# Patient Record
Sex: Male | Born: 1935 | Hispanic: No | Marital: Married | Smoking: Former smoker
Health system: Southern US, Community
[De-identification: ages and names within clinical notes are randomized; demographics above are authoritative.]

## PROBLEM LIST (undated history)

## (undated) DIAGNOSIS — I495 Sick sinus syndrome: Secondary | ICD-10-CM

## (undated) DIAGNOSIS — M2550 Pain in unspecified joint: Secondary | ICD-10-CM

## (undated) DIAGNOSIS — R55 Syncope and collapse: Secondary | ICD-10-CM

## (undated) DIAGNOSIS — E785 Hyperlipidemia, unspecified: Secondary | ICD-10-CM

## (undated) DIAGNOSIS — I10 Essential (primary) hypertension: Secondary | ICD-10-CM

## (undated) DIAGNOSIS — Z950 Presence of cardiac pacemaker: Secondary | ICD-10-CM

## (undated) DIAGNOSIS — I679 Cerebrovascular disease, unspecified: Secondary | ICD-10-CM

## (undated) DIAGNOSIS — IMO0002 Reserved for concepts with insufficient information to code with codable children: Secondary | ICD-10-CM

## (undated) DIAGNOSIS — R001 Bradycardia, unspecified: Secondary | ICD-10-CM

## (undated) HISTORY — DX: Sick sinus syndrome: I49.5

## (undated) HISTORY — DX: Cerebrovascular disease, unspecified: I67.9

## (undated) HISTORY — DX: Essential (primary) hypertension: I10

## (undated) HISTORY — DX: Bradycardia, unspecified: R00.1

## (undated) HISTORY — DX: Pain in unspecified joint: M25.50

## (undated) HISTORY — DX: Syncope and collapse: R55

## (undated) HISTORY — DX: Presence of cardiac pacemaker: Z95.0

## (undated) HISTORY — PX: PACEMAKER INSERTION: SHX728

## (undated) HISTORY — DX: Hyperlipidemia, unspecified: E78.5

## (undated) HISTORY — DX: Reserved for concepts with insufficient information to code with codable children: IMO0002

---
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
## 1968-08-15 HISTORY — PX: CERVICAL DISC SURGERY: SHX588

## 2003-08-16 HISTORY — PX: CATARACT EXTRACTION: SUR2

## 2009-09-07 ENCOUNTER — Encounter: Payer: Self-pay | Admitting: Cardiovascular Disease

## 2009-09-16 ENCOUNTER — Ambulatory Visit: Payer: Self-pay | Admitting: Cardiovascular Disease

## 2009-09-16 DIAGNOSIS — G459 Transient cerebral ischemic attack, unspecified: Secondary | ICD-10-CM | POA: Insufficient documentation

## 2009-09-16 DIAGNOSIS — E785 Hyperlipidemia, unspecified: Secondary | ICD-10-CM

## 2009-09-16 DIAGNOSIS — I10 Essential (primary) hypertension: Secondary | ICD-10-CM | POA: Insufficient documentation

## 2009-09-17 ENCOUNTER — Telehealth: Payer: Self-pay | Admitting: Cardiovascular Disease

## 2009-10-01 ENCOUNTER — Encounter: Payer: Self-pay | Admitting: Cardiovascular Disease

## 2009-10-02 ENCOUNTER — Encounter: Payer: Self-pay | Admitting: Cardiovascular Disease

## 2010-01-20 ENCOUNTER — Telehealth: Payer: Self-pay | Admitting: Cardiovascular Disease

## 2010-09-14 NOTE — Letter (Signed)
Summary: Medical Record Release  Medical Record Release   Imported By: Harlon Flor 10/01/2009 15:06:08  _____________________________________________________________________  External Attachment:    Type:   Image     Comment:   External Document

## 2010-09-14 NOTE — Assessment & Plan Note (Signed)
Summary: NP6/AMD   Visit Type:  New Patient Primary Provider:  Dr. Terance Hart  CC:  edema in ankles - arthritis...  History of Present Illness: [name] is a very pleasant 77 year old gentleman who is new to our cardiology who presents from [facility] where he was seen by Dr. Lorie Phenix and Dr. Ron Agee for his medical issues. He has a history of numerous TIAs over the past 7 years, approximately 6 and history of stroke in 2008 per his report. He has hyperlipidemia, hypertension.  Johnny Wright states that overall he's been feeling well with no significant symptoms of chest pain, shortness of breath. He does have significant lower back pain which he attributes to arthritis and DJD. He is unable to exercise secondary to his pain. He is recently moved and settled at [facility] with his wife.   Johnny Wright states that his cholesterol is approximately 170-190 and was last checked in August of 2010. His blood pressures at home are typically in the 130s to 140s systolic and he has "white coat syndrome".  Preventive Screening-Counseling & Management  Alcohol-Tobacco     Alcohol drinks/day: <1     Alcohol type: wine     Smoking Status: quit     Packs/Day: 1.0     Year Quit: 1969  Caffeine-Diet-Exercise     Caffeine use/day: no     Does Patient Exercise: yes      Drug Use:  no.    Current Medications (verified): 1)  Plavix 75 Mg Tabs (Clopidogrel Bisulfate) .... Take One Tablet By Mouth Daily 2)  Sinemet Cr 50-200 Mg Cr-Tabs (Carbidopa-Levodopa) .Marland Kitchen.. 1 By Mouth Three Times A Day 3)  Lipitor 20 Mg Tabs (Atorvastatin Calcium) .... 1/2  Tablet By Mouth Daily. 4)  Terazosin Hcl 10 Mg Caps (Terazosin Hcl) .Marland Kitchen.. 1 By Mouth At Bedtime 5)  Lisinopril 40 Mg Tabs (Lisinopril) .... Take One Tablet By Mouth As Needed 6)  Clonidine Hcl 0.1 Mg Tabs (Clonidine Hcl) .Marland Kitchen.. 1 By Mouth As Needed If Bp Is Over 160 7)  Saw Palmetto 450 Mg Caps (Saw Palmetto (Serenoa Repens)) .Marland Kitchen.. 1 By Mouth Once Daily 8)   Fish Oil 1200 Mg Caps (Omega-3 Fatty Acids) .Marland Kitchen.. 1 By Mouth Once Daily 9)  A Thru Z Advantage  Tabs (Specialty Vitamins Products) .Marland Kitchen.. 1 By Mouth Once Daily 10)  Aspirin 81 Mg Tbec (Aspirin) .... Take One Tablet By Mouth Every Other Day  Allergies (verified): No Known Drug Allergies  Past History:  Past Medical History: Arthritis Cerebrovascular Disease Hyperlipidemia Hypertension Early stages of Parkinson's disease  Past Surgical History: Appendectomy 1945 Disk Surgery 1970 Cataract Extraction 2005   Family History: Father:deceased 25: car accident Mother: deceased 78: old age Siblings: brother died 26 with lung cancer  Social History: Retired  Married  Tobacco Use - Former.  Alcohol Use - yes Regular Exercise - yes Drug Use - no Alcohol drinks/day:  <1 Smoking Status:  quit Packs/Day:  1.0 Caffeine use/day:  no Does Patient Exercise:  yes Drug Use:  no  Review of Systems       Chronic Back pain  Vital Signs:  Patient profile:   77 year old male Height:      72 inches Weight:      188 pounds Pulse rate:   70 / minute Pulse rhythm:   regular BP sitting:   186 / 76  (left arm) Cuff size:   regular  Vitals Entered By: Mercer Pod (September 16, 2009 10:20 AM)  Physical Exam  General:  Well-appearing elderly channel and in no apparent distress. His balance is poor and he required at least one-person assist off the table and onto the table. He normally uses a walker at home. His HEENT exam is benign oropharynx is clear neck is supple with no JVP. 1+ carotid bruits on the left.  heart sounds are regular with normal S1 and S2 with no murmurs appreciated lungs are clear to auscultation with no wheezes or rales abdominal exam is benign. Is no significant lower extremity edema. Neurological exam is grossly nonfocal. Skin is warm and dry. Pulses are equal and symmetrical in his upper and lower extremities.   New Orders:     1)  Misc. Referral (Misc.  Ref)   Problems:  Medical Problems Added: 1)  Dx of Hyperlipidemia  (ICD-272.4) 2)  Dx of Tia  (ICD-435.9) 3)  Dx of Hypertension, Benign  (ICD-401.1)  Impression & Recommendations:  Problem # 1:  HYPERTENSION, BENIGN (ICD-401.1) blood pressure is elevated today even on repeat evaluation. He has chronic pain at home and we have recommended that he takes his pill today. He states that he has white coat syndrome and typically it is well-controlled at home. We've asked the nurse at [facility] to help [place] monitor his blood pressure and to call us with his measurements. I suspect that he would need an additional medication for his blood pressure. One option would be to start Norvasc 5 or possibly 10 mg daily.we will see him back in one month's time for close followup of his blood pressure I will communicate with the nurse at [facility] prior to then. His updated medication list for this problem includes:    Terazosin Hcl 10 Mg Caps (Terazosin hcl) .Marland Kitchen... 1 by mouth at bedtime    Lisinopril 40 Mg Tabs (Lisinopril) .Marland Kitchen... Take one tablet by mouth as needed    Clonidine Hcl 0.1 Mg Tabs (Clonidine hcl) .Marland Kitchen... 1 by mouth as needed if bp is over 160    Aspirin 81 Mg Tbec (Aspirin) .Marland Kitchen... Take one tablet by mouth every other day  Orders: Misc. Referral (Misc. Ref)  Problem # 2:  HYPERLIPIDEMIA (ICD-272.4) Mr. Vahle has a history of hyperlipidemia and we'll try to obtain his most recent lipid panel from August of 2010. He is on Lipitor 10 mg daily. He did have myalgias on Lipitor 40 mg daily. I am concerned about peripheral arterial disease given his history of TIAs and aggressive lipid management is indicated. His updated medication list for this problem includes:    Lipitor 20 Mg Tabs (Atorvastatin calcium) .Marland Kitchen... 1/2  tablet by mouth daily.  Problem # 3:  TIA (ICD-435.9) The etiology of his TIAs is uncertain. He does have a 1+ carotid bruit on the left though he states that he has had a carotid  ultrasound 2 years ago that did not show significant stenosis.we will try to obtain this report for our records. The other possible etiology for his TIAs could be poorly controlled hypertension and this will need to be followed closely.  Patient Instructions: 1)  Your physician recommends that you schedule a follow-up appointment in: 6 months 2)  Your physician recommends that you continue on your current medications as directed. Please refer to the Current Medication list given to you today.

## 2010-09-14 NOTE — Progress Notes (Signed)
Summary: PHI  PHI   Imported By: Harlon Flor 09/11/2009 09:59:15  _____________________________________________________________________  External Attachment:    Type:   Image     Comment:   External Document

## 2010-09-14 NOTE — Progress Notes (Signed)
Summary: BP issues  Phone Note From Other Clinic   Summary of Call: [facility] RN @ [facility] called this morning following pts first BP Check: 222/90-98.  No complaints of HA or dizziness.  BP checked by 2 RN's.  Pt had not taken any clonidine this morning.  Per Dr. Mariah Milling- start norvasc 10 mg daily and make sure pt is taking lisinopril 40 mg daily as ordered.  will continue with daily BP checks.  Initial call taken by: Charlena Cross, RN, BSN,  September 17, 2009 12:55 PM     Appended Document: BP issues    Clinical Lists Changes  Medications: Changed medication from AMLODIPINE BESYLATE 10 MG TABS (AMLODIPINE BESYLATE) Take one tablet by mouth daily to AMLODIPINE BESYLATE 10 MG TABS (AMLODIPINE BESYLATE) Take 1/2 tablet by mouth twice daily

## 2010-09-14 NOTE — Progress Notes (Signed)
Summary: RX amlodipine  Phone Note Refill Request Call back at Home Phone [phone] Message from:  [facility]) on January 20, 2010 11:53 AM  Refills Requested: Medication #1:  AMLODIPINE BESYLATE 10 MG TABS Take 1/2 tablet by mouth twice daily. [facility] ON [address]  Initial call taken by: Harlon Flor,  January 20, 2010 11:54 AM    Prescriptions: AMLODIPINE BESYLATE 10 MG TABS (AMLODIPINE BESYLATE) Take 1/2 tablet by mouth twice daily  #30 x 6   Entered by:   Danielle Rankin, CMA   Authorized by:   Dossie Arbour MD   Signed by:   Danielle Rankin, CMA on 01/20/2010   Method used:   Electronically to        [facility] [address] [phone]* (retail)       [address] [place]  [address]       Ph: [phone]       Fax: [phone]   RxID:   [id number]

## 2011-02-08 ENCOUNTER — Other Ambulatory Visit: Payer: Self-pay | Admitting: Emergency Medicine

## 2011-02-08 MED ORDER — AMLODIPINE BESYLATE 10 MG PO TABS: 5.0000 mg | ORAL_TABLET | Freq: Two times a day (BID) | ORAL | Status: DC

## 2011-03-08 ENCOUNTER — Encounter: Payer: Self-pay | Admitting: Cardiovascular Disease

## 2011-03-18 ENCOUNTER — Encounter: Payer: Self-pay | Admitting: Cardiovascular Disease

## 2011-03-18 ENCOUNTER — Ambulatory Visit (INDEPENDENT_AMBULATORY_CARE_PROVIDER_SITE_OTHER): Payer: Medicare Other | Admitting: Cardiovascular Disease

## 2011-03-18 ENCOUNTER — Encounter: Payer: Medicare Other | Admitting: *Deleted

## 2011-03-18 DIAGNOSIS — I4892 Unspecified atrial flutter: Secondary | ICD-10-CM

## 2011-03-18 DIAGNOSIS — I10 Essential (primary) hypertension: Secondary | ICD-10-CM

## 2011-03-18 DIAGNOSIS — E785 Hyperlipidemia, unspecified: Secondary | ICD-10-CM

## 2011-03-18 MED ORDER — FUROSEMIDE 20 MG PO TABS: 20.0000 mg | ORAL_TABLET | Freq: Two times a day (BID) | ORAL | Status: DC

## 2011-03-18 MED ORDER — POTASSIUM CHLORIDE ER 10 MEQ PO TBCR: 20.0000 meq | EXTENDED_RELEASE_TABLET | Freq: Two times a day (BID) | ORAL | Status: DC

## 2011-03-18 MED ORDER — DILTIAZEM HCL ER COATED BEADS 120 MG PO CP24: 120.0000 mg | ORAL_CAPSULE | Freq: Every day | ORAL | Status: DC

## 2011-03-18 NOTE — Assessment & Plan Note (Signed)
We'll continue Lipitor for now

## 2011-03-18 NOTE — Progress Notes (Signed)
Patient ID: Johnny Wright, male    DOB: [birth year], 77 y.o.   MRN: [id number]  HPI Comments: Johnny Wright is a very pleasant 77 year old gentleman with a history of numerous TIAs over the past 7 years, approximately 6 and history of stroke in 2008 per his report. He has hyperlipidemia, hypertension. He presents for urgent visit after we received a call from Dr. Sandria Manly of [facility] neurology in [place] for a heart rate of 120 beats per minute.  [name] reports that he has had increased swelling in his legs, increased swelling in his abdomen over the past several days. He has had mild increased shortness of breath though has been able to walk with his walker at his usual pace without any difficulty. Blood pressure measurements and heart rate measurements show an increase in his pulse rate starting on July 26 with initial a.m. Heart rate 114, p.m. Heart rate of 120. Since that time he has had a heart rate of approximately 127 beats per minute.   EKG shows atrial flutter with ventricular rate 133 beats per minute    Outpatient Encounter Prescriptions as of 03/18/2011  Medication Sig Dispense Refill  . aspirin (ASPIR-81) 81 MG EC tablet Take 81 mg by mouth every other day.        Marland Kitchen atorvastatin (LIPITOR) 20 MG tablet Take 10 mg by mouth daily.        . carbidopa-levodopa (SINEMET CR) 50-200 MG per tablet Take 1 tablet by mouth 3 (three) times daily.        . clopidogrel (PLAVIX) 75 MG tablet Take 75 mg by mouth daily.        . diclofenac (FLECTOR) 1.3 % PTCH Place 1 patch onto the skin 2 (two) times daily.        . diclofenac (VOLTAREN) 0.1 % ophthalmic solution 1 drop 4 (four) times daily.        Marland Kitchen glucosamine-chondroitin 500-400 MG tablet Take 1 tablet by mouth once.        . Omega-3 Fatty Acids (FISH OIL) 1200 MG CAPS Take by mouth daily.        . Saw Palmetto 450 MG CAPS Take by mouth daily.        Marland Kitchen Specialty Vitamins Products (A THRU Z ADVANTAGE) TABS Take by mouth daily.        Marland Kitchen  terazosin (HYTRIN) 10 MG capsule Take 10 mg by mouth at bedtime.        Marland Kitchen amLODipine (NORVASC) 10 MG tablet Take 0.5 tablets (5 mg total) by mouth 2 (two) times daily.  30 tablet  6  . cloNIDine (CATAPRES) 0.1 MG tablet Take 0.1 mg by mouth as needed. If bp is over 160   NOT TAKING       Review of Systems  Constitutional: Negative.   HENT: Negative.   Eyes: Negative.   Respiratory: Positive for shortness of breath.   Cardiovascular: Positive for leg swelling.  Gastrointestinal: Negative.   Musculoskeletal: Negative.   Skin: Negative.   Neurological: Negative.   Hematological: Negative.   Psychiatric/Behavioral: Negative.   All other systems reviewed and are negative.    BP 138/100  Pulse 133  Ht 6' (1.829 m)  Wt 206 lb (93.441 kg)  BMI 27.94 kg/m2  Physical Exam  Nursing note and vitals reviewed. Constitutional: He is oriented to person, place, and time. He appears well-developed and well-nourished.  HENT:  Head: Normocephalic.  Nose: Nose normal.  Mouth/Throat: Oropharynx is clear and moist.  Eyes: Conjunctivae are normal. Pupils are equal, round, and reactive to light.  Neck: Normal range of motion. Neck supple. No JVD present.  Cardiovascular: Regular rhythm, S1 normal, S2 normal, normal heart sounds and intact distal pulses.  Tachycardia present.  Exam reveals no gallop and no friction rub.   No murmur heard.      2+ pitting edema to below the knees, 1+ edema in the thighs  Pulmonary/Chest: Effort normal and breath sounds normal. No respiratory distress. He has no wheezes. He has no rales. He exhibits no tenderness.  Abdominal: Soft. Bowel sounds are normal. He exhibits no distension. There is no tenderness.  Musculoskeletal: Normal range of motion. He exhibits no edema and no tenderness.  Lymphadenopathy:    He has no cervical adenopathy.  Neurological: He is alert and oriented to person, place, and time. Coordination normal.  Skin: Skin is warm and dry. No rash  noted. No erythema.  Psychiatric: He has a normal mood and affect. His behavior is normal. Judgment and thought content normal.           Assessment and Plan

## 2011-03-18 NOTE — Assessment & Plan Note (Signed)
Appears to be in atrial flutter over the past week. Relatively asymptomatic apart from some edema in his legs. He is able to walk around the office without significant shortness of breath. Lungs are clear on exam. No signs of respiratory distress. We will start him on Lasix 20 mg b.i.d. With potassium. We will discontinue his amlodipine and start diltiazem for a followup in 3 days time in the office for EKG.   Uncertain if he is a candidate for anticoagulation given his history of TIAs and strokes. We did discuss this with Dr. Sandria Manly though we do not have any imaging such as CT scan of Johnny Wright head to risk stratify. If we are unable to convert him to sinus rhythm pharmacologically, we may have to perform a TEE and cardioversion. We would use pradaxa in the peri-procedure period.

## 2011-03-18 NOTE — Assessment & Plan Note (Addendum)
We are holding the amlodipine and starting diltiazem with Lasix. He does have clonidine though does not use this on a regular basis, only p.r.n. For significant hypertension

## 2011-03-18 NOTE — Patient Instructions (Signed)
Please hold amlodipine. Please start diltiazem 120 mg daily Please start lasix 20 mg twice a day With lasix, please start potassium two pills in the am and two pills in the PM Come back to the clinic on Monday at 9:45 AM

## 2011-03-21 ENCOUNTER — Ambulatory Visit (INDEPENDENT_AMBULATORY_CARE_PROVIDER_SITE_OTHER): Payer: Medicare Other | Admitting: Cardiovascular Disease

## 2011-03-21 ENCOUNTER — Encounter: Payer: Self-pay | Admitting: *Deleted

## 2011-03-21 ENCOUNTER — Encounter: Payer: Self-pay | Admitting: Cardiovascular Disease

## 2011-03-21 DIAGNOSIS — Z01810 Encounter for preprocedural cardiovascular examination: Secondary | ICD-10-CM

## 2011-03-21 DIAGNOSIS — I4892 Unspecified atrial flutter: Secondary | ICD-10-CM

## 2011-03-21 DIAGNOSIS — G459 Transient cerebral ischemic attack, unspecified: Secondary | ICD-10-CM

## 2011-03-21 DIAGNOSIS — E785 Hyperlipidemia, unspecified: Secondary | ICD-10-CM

## 2011-03-21 DIAGNOSIS — I10 Essential (primary) hypertension: Secondary | ICD-10-CM

## 2011-03-21 LAB — CBC WITH DIFFERENTIAL/PLATELET
Basophils Absolute: 0 10*3/uL (ref 0.0–0.1)
Eosinophils Absolute: 0.1 10*3/uL (ref 0.0–0.7)
Eosinophils Relative: 1 % (ref 0–5)
HCT: 37.5 % — ABNORMAL LOW (ref 39.0–52.0)
Lymphocytes Relative: 15 % (ref 12–46)
MCH: 31.3 pg (ref 26.0–34.0)
MCHC: 32.3 g/dL (ref 30.0–36.0)
MCV: 97.2 fL (ref 78.0–100.0)
Monocytes Absolute: 0.9 10*3/uL (ref 0.1–1.0)
Platelets: 197 10*3/uL (ref 150–400)
RDW: 16.1 % — ABNORMAL HIGH (ref 11.5–15.5)
WBC: 7 10*3/uL (ref 4.0–10.5)

## 2011-03-21 LAB — PROTIME-INR: Prothrombin Time: 14.7 seconds (ref 11.6–15.2)

## 2011-03-21 LAB — BASIC METABOLIC PANEL
BUN: 26 mg/dL — ABNORMAL HIGH (ref 6–23)
Calcium: 8.9 mg/dL (ref 8.4–10.5)
Creat: 1.21 mg/dL (ref 0.50–1.35)
Glucose, Bld: 90 mg/dL (ref 70–99)
Sodium: 137 mEq/L (ref 135–145)

## 2011-03-21 NOTE — Patient Instructions (Signed)
Please continue your medications. If you do not have good urine output in the morning after one lasix, take two lasix in the afternoon Stay on potassium twice a day You procedure has been scheduled for Wednesday.

## 2011-03-21 NOTE — Assessment & Plan Note (Signed)
No changes made to the medications. 

## 2011-03-21 NOTE — Assessment & Plan Note (Signed)
No recent TIA-type symptoms 

## 2011-03-21 NOTE — Assessment & Plan Note (Signed)
Continued atrial flutter through the weekend despite diltiazem. We will schedule a TEE and cardioversion this Wednesday in 2 days' time. Given his age and the fact that he is on aspirin and Plavix already, we will not start warfarin or pradaxa.

## 2011-03-21 NOTE — Assessment & Plan Note (Signed)
-  Continue Lipitor °

## 2011-03-21 NOTE — Progress Notes (Signed)
Patient ID: ANIKEN MONESTIME, male    DOB: [birth year], 77 y.o.   MRN: [id number]  HPI Comments: Mr. Damany Eastman is a very pleasant 77 year old gentleman with a history of numerous TIAs over the past 7 years, approximately 6 and history of stroke in 2008 per his report. He has hyperlipidemia, hypertension. He presents for routine followup after a visit last week for atrial flutter.  He was started on diltiazem and Lasix b.i.d. With no improvement of his heart rate. His edema has stabilized and he leaves his abdomen is less distended. Lasix b.i.d. Did cause an increase of his urine output. He denies any significant shortness of breath.   EKG shows atrial flutter with ventricular rate 133 beats per minute    Outpatient Encounter Prescriptions as of 03/18/2011  Medication Sig Dispense Refill  . aspirin (ASPIR-81) 81 MG EC tablet Take 81 mg by mouth every other day.        Marland Kitchen atorvastatin (LIPITOR) 20 MG tablet Take 10 mg by mouth daily.        . carbidopa-levodopa (SINEMET CR) 50-200 MG per tablet Take 1 tablet by mouth 3 (three) times daily.        . clopidogrel (PLAVIX) 75 MG tablet Take 75 mg by mouth daily.        . diclofenac (FLECTOR) 1.3 % PTCH Place 1 patch onto the skin 2 (two) times daily.        . diclofenac (VOLTAREN) 0.1 % ophthalmic solution 1 drop 4 (four) times daily.        Marland Kitchen glucosamine-chondroitin 500-400 MG tablet Take 1 tablet by mouth once.        . Omega-3 Fatty Acids (FISH OIL) 1200 MG CAPS Take by mouth daily.        . Saw Palmetto 450 MG CAPS Take by mouth daily.        Marland Kitchen Specialty Vitamins Products (A THRU Z ADVANTAGE) TABS Take by mouth daily.        Marland Kitchen terazosin (HYTRIN) 10 MG capsule Take 10 mg by mouth at bedtime.        Marland Kitchen amLODipine (NORVASC) 10 MG tablet Take 0.5 tablets (5 mg total) by mouth 2 (two) times daily.  30 tablet  6  . cloNIDine (CATAPRES) 0.1 MG tablet Take 0.1 mg by mouth as needed. If bp is over 160   NOT TAKING       Review of Systems    Constitutional: Negative.   HENT: Negative.   Eyes: Negative.   Cardiovascular: Positive for leg swelling.  Gastrointestinal: Negative.   Musculoskeletal: Negative.   Skin: Negative.   Neurological: Negative.   Hematological: Negative.   Psychiatric/Behavioral: Negative.   All other systems reviewed and are negative.    BP 147/83  Pulse 133  Wt 207 lb (93.895 kg)  Physical Exam  Nursing note and vitals reviewed. Constitutional: He is oriented to person, place, and time. He appears well-developed and well-nourished.  HENT:  Head: Normocephalic.  Nose: Nose normal.  Mouth/Throat: Oropharynx is clear and moist.  Eyes: Conjunctivae are normal. Pupils are equal, round, and reactive to light.  Neck: Normal range of motion. Neck supple. No JVD present.  Cardiovascular: Regular rhythm, S1 normal, S2 normal, normal heart sounds and intact distal pulses.  Tachycardia present.  Exam reveals no gallop and no friction rub.   No murmur heard.      2+ pitting edema to below the knees, Trace to 1+ edema in the thighs  Pulmonary/Chest: Effort normal and breath sounds normal. No respiratory distress. He has no wheezes. He has no rales. He exhibits no tenderness.  Abdominal: Soft. Bowel sounds are normal. He exhibits no distension. There is no tenderness.  Musculoskeletal: Normal range of motion. He exhibits no edema and no tenderness.  Lymphadenopathy:    He has no cervical adenopathy.  Neurological: He is alert and oriented to person, place, and time. Coordination normal.  Skin: Skin is warm and dry. No rash noted. No erythema.  Psychiatric: He has a normal mood and affect. His behavior is normal. Judgment and thought content normal.           Assessment and Plan

## 2011-03-23 ENCOUNTER — Encounter: Payer: Self-pay | Admitting: Cardiovascular Disease

## 2011-03-23 ENCOUNTER — Other Ambulatory Visit: Payer: Self-pay | Admitting: Cardiovascular Disease

## 2011-03-23 ENCOUNTER — Other Ambulatory Visit: Payer: Self-pay | Admitting: *Deleted

## 2011-03-23 DIAGNOSIS — I4892 Unspecified atrial flutter: Secondary | ICD-10-CM

## 2011-03-23 MED ORDER — METOPROLOL TARTRATE 50 MG PO TABS: 50.0000 mg | ORAL_TABLET | Freq: Two times a day (BID) | ORAL | Status: DC

## 2011-03-25 ENCOUNTER — Ambulatory Visit (INDEPENDENT_AMBULATORY_CARE_PROVIDER_SITE_OTHER): Payer: Medicare Other | Admitting: Cardiovascular Disease

## 2011-03-25 ENCOUNTER — Encounter: Payer: Self-pay | Admitting: Cardiovascular Disease

## 2011-03-25 DIAGNOSIS — I10 Essential (primary) hypertension: Secondary | ICD-10-CM

## 2011-03-25 DIAGNOSIS — I4892 Unspecified atrial flutter: Secondary | ICD-10-CM

## 2011-03-25 DIAGNOSIS — R0602 Shortness of breath: Secondary | ICD-10-CM

## 2011-03-25 DIAGNOSIS — R609 Edema, unspecified: Secondary | ICD-10-CM

## 2011-03-25 DIAGNOSIS — E785 Hyperlipidemia, unspecified: Secondary | ICD-10-CM

## 2011-03-25 LAB — BASIC METABOLIC PANEL
BUN: 22 mg/dL (ref 6–23)
Calcium: 9.2 mg/dL (ref 8.4–10.5)
Creat: 1.23 mg/dL (ref 0.50–1.35)
Potassium: 4.3 mEq/L (ref 3.5–5.3)

## 2011-03-25 MED ORDER — METOPROLOL TARTRATE 50 MG PO TABS: 100.0000 mg | ORAL_TABLET | Freq: Two times a day (BID) | ORAL | Status: DC

## 2011-03-25 NOTE — Assessment & Plan Note (Signed)
He continues to have significant edema. This should improve on diuresis, now that he is out of atrial flutter. We will continue to monitor him closely. We'll check a basic metabolic panel today.

## 2011-03-25 NOTE — Assessment & Plan Note (Signed)
Metoprolol was increased to 100 mg b.i.d. For rate control

## 2011-03-25 NOTE — Assessment & Plan Note (Signed)
We have suggested he stay on his Lipitor

## 2011-03-25 NOTE — Progress Notes (Signed)
Patient ID: Johnny Wright, male    DOB: June 12, 1921, 77 y.o.   MRN: [id number]  HPI Comments: [name] is a very pleasant 77 year old gentleman with a history of numerous TIAs over the past 7 years,  history of stroke in 2008 per his report. He has hyperlipidemia, hypertension, Recent onset of  atrial flutter, Status post TEE and cardioversion 2 days ago which was successful at restoring normal sinus rhythm. He had sinus tachycardia after the procedure and was started on metoprolol tartrate 25 mg b.i.d.. He reports that he has been taking 50 mg b.i.d.  He has had mild improvement of his edema with Lasix 20 mg b.i.d.. He does have fatigue. He is not very active at baseline. He presents with his daughter.   EKG shows Sinus tachycardia with a rate of 101 beats per minute    Outpatient Encounter Prescriptions as of 03/25/2011  Medication Sig Dispense Refill  . aspirin (ASPIR-81) 81 MG EC tablet Take 81 mg by mouth every other day.        Marland Kitchen atorvastatin (LIPITOR) 20 MG tablet Take 10 mg by mouth daily.        . carbidopa-levodopa (SINEMET CR) 50-200 MG per tablet Take 1 tablet by mouth 3 (three) times daily.        . cloNIDine (CATAPRES) 0.1 MG tablet Take 0.1 mg by mouth as needed. If bp is over 160       . clopidogrel (PLAVIX) 75 MG tablet Take 75 mg by mouth daily.        . diclofenac (FLECTOR) 1.3 % PTCH Place 1 patch onto the skin 2 (two) times daily.        . diclofenac (VOLTAREN) 0.1 % ophthalmic solution 1 drop 4 (four) times daily.        Marland Kitchen diltiazem (CARDIZEM CD) 120 MG 24 hr capsule Take 1 capsule (120 mg total) by mouth daily.  30 capsule  11  . furosemide (LASIX) 20 MG tablet Take 1 tablet (20 mg total) by mouth 2 (two) times daily.  60 tablet  11  . glucosamine-chondroitin 500-400 MG tablet Take 1 tablet by mouth once.        . metoprolol (LOPRESSOR) 50 MG tablet Take 2 tablets (100 mg total) by mouth 2 (two) times daily.  60 tablet  6  . Omega-3 Fatty Acids (FISH OIL)  1200 MG CAPS Take by mouth daily.        . potassium chloride (K-DUR) 10 MEQ tablet Take 2 tablets (20 mEq total) by mouth 2 (two) times daily.  120 tablet  6  . Saw Palmetto 450 MG CAPS Take by mouth daily.        Marland Kitchen Specialty Vitamins Products (A THRU Z ADVANTAGE) TABS Take by mouth daily.        Marland Kitchen terazosin (HYTRIN) 10 MG capsule Take 10 mg by mouth at bedtime.        .  metoprolol (LOPRESSOR) 50 MG tablet Take 1 tablet (50 mg total) by mouth 2 (two) times daily.  60 tablet  6     Review of Systems  Constitutional: Positive for fatigue.  HENT: Negative.   Eyes: Negative.   Cardiovascular: Positive for leg swelling.  Gastrointestinal: Negative.   Musculoskeletal: Negative.   Skin: Negative.   Neurological: Negative.   Hematological: Negative.   Psychiatric/Behavioral: Negative.   All other systems reviewed and are negative.    BP 140/90  Pulse 100  Ht 6' (1.829 m)  Wt 204 lb (92.534 kg)  BMI 27.67 kg/m2  Physical Exam  Nursing note and vitals reviewed. Constitutional: He is oriented to person, place, and time. He appears well-developed and well-nourished.  HENT:  Head: Normocephalic.  Nose: Nose normal.  Mouth/Throat: Oropharynx is clear and moist.  Eyes: Conjunctivae are normal. Pupils are equal, round, and reactive to light.  Neck: Normal range of motion. Neck supple. No JVD present.  Cardiovascular: Regular rhythm, S1 normal, S2 normal, normal heart sounds and intact distal pulses.  Tachycardia present.  Exam reveals no gallop and no friction rub.   No murmur heard.      1+ pitting edema to below the knees  Pulmonary/Chest: Effort normal and breath sounds normal. No respiratory distress. He has no wheezes. He has no rales. He exhibits no tenderness.  Abdominal: Soft. Bowel sounds are normal. He exhibits no distension. There is no tenderness.  Musculoskeletal: Normal range of motion. He exhibits no edema and no tenderness.  Lymphadenopathy:    He has no cervical  adenopathy.  Neurological: He is alert and oriented to person, place, and time. Coordination normal.  Skin: Skin is warm and dry. No rash noted. No erythema.  Psychiatric: He has a normal mood and affect. His behavior is normal. Judgment and thought content normal.           Assessment and Plan

## 2011-03-25 NOTE — Patient Instructions (Signed)
Increase the metoprolol to 100 mg in the Am and PM Take two of the metoprolol 50 in the Am and PM. Continue the lasix. We will check the blood work today Please call us if you have new issues that need to be addressed before your next appt.  We will call you for a follow up Appt. In 1 week

## 2011-03-25 NOTE — Assessment & Plan Note (Signed)
He is maintaining sinus rhythm though currently in sinus tachycardia. We will increase his metoprolol to 100 mg b.i.d. With close monitoring of his heart rate and blood pressure. Continue diltiazem.

## 2011-03-26 LAB — BRAIN NATRIURETIC PEPTIDE: Brain Natriuretic Peptide: 265.6 pg/mL — ABNORMAL HIGH (ref 0.0–100.0)

## 2011-03-28 ENCOUNTER — Encounter: Payer: Self-pay | Admitting: *Deleted

## 2011-03-31 ENCOUNTER — Encounter: Payer: Self-pay | Admitting: Cardiovascular Disease

## 2011-03-31 ENCOUNTER — Ambulatory Visit (INDEPENDENT_AMBULATORY_CARE_PROVIDER_SITE_OTHER): Payer: Medicare Other | Admitting: Cardiovascular Disease

## 2011-03-31 VITALS — BP 146/82 | HR 99 | Ht 72.0 in | Wt 202.0 lb

## 2011-03-31 DIAGNOSIS — R609 Edema, unspecified: Secondary | ICD-10-CM

## 2011-03-31 DIAGNOSIS — E785 Hyperlipidemia, unspecified: Secondary | ICD-10-CM

## 2011-03-31 DIAGNOSIS — I4892 Unspecified atrial flutter: Secondary | ICD-10-CM

## 2011-03-31 DIAGNOSIS — G459 Transient cerebral ischemic attack, unspecified: Secondary | ICD-10-CM

## 2011-03-31 DIAGNOSIS — I10 Essential (primary) hypertension: Secondary | ICD-10-CM

## 2011-03-31 MED ORDER — METOPROLOL TARTRATE 50 MG PO TABS: 50.0000 mg | ORAL_TABLET | Freq: Two times a day (BID) | ORAL | Status: DC

## 2011-03-31 NOTE — Assessment & Plan Note (Signed)
Blood pressure is well controlled on today's visit. No changes made to the medications. 

## 2011-03-31 NOTE — Assessment & Plan Note (Signed)
   Continue aspirin and Plavix 

## 2011-03-31 NOTE — Patient Instructions (Signed)
You are doing well. Please decrease the metoprolol back to one in the Am and PM (50 tab) If you continue to have significant fatigue, please call the office as we could decrease the metoprolol to 25 mg in the Am and PM  Please weight yourself every other day If your weight goes down three pound (less than 198), hold the lasix If your weight goes up three pounds, take lasix twice a day If your weight is stable (198 to 202), take lasix one a day Please call us if you have new issues that need to be addressed before your next appt.  We will call you for a follow up Appt. In 3 months

## 2011-03-31 NOTE — Progress Notes (Signed)
Patient ID: Johnny Wright, male    DOB: Aug 02, 1921, 77 y.o.   MRN: [id number]  HPI Comments: Mr. Vincen Bejar is a very pleasant 77 year old gentleman with a history of numerous TIAs over the past 7 years,  history of stroke in 2008 per his report. He has hyperlipidemia, hypertension, Recent onset of  atrial flutter, Status post TEE and cardioversion at the end of June which was initially felt to have restored normal sinus rhythm, though in hindsight likely converted him to a slower atrial flutter with ventricular rate 98-100 beats per minute.   On his last clinic visit, we increased his metoprolol in the hope that this would slow his rate. His heart rate has stayed at high 90s to 100 beats per minute and he now has more fatigue. He has not been participating in physical therapy secondary to fatigue. His blood pressure has been stable and has not been low. His edema has significantly improved but he continues to have trace edema above the sock line. He continues on Lasix 20 mg b.i.d.   EKG shows atrial flutter with ventricular rate 100 beats per minute    Outpatient Encounter Prescriptions as of 03/25/2011  Medication Sig Dispense Refill  . aspirin (ASPIR-81) 81 MG EC tablet Take 81 mg by mouth every other day.        Marland Kitchen atorvastatin (LIPITOR) 20 MG tablet Take 10 mg by mouth daily.        . carbidopa-levodopa (SINEMET CR) 50-200 MG per tablet Take 1 tablet by mouth 3 (three) times daily.        . cloNIDine (CATAPRES) 0.1 MG tablet Take 0.1 mg by mouth as needed. If bp is over 160       . clopidogrel (PLAVIX) 75 MG tablet Take 75 mg by mouth daily.        . diclofenac (FLECTOR) 1.3 % PTCH Place 1 patch onto the skin 2 (two) times daily.        . diclofenac (VOLTAREN) 0.1 % ophthalmic solution 1 drop 4 (four) times daily.        Marland Kitchen diltiazem (CARDIZEM CD) 120 MG 24 hr capsule Take 1 capsule (120 mg total) by mouth daily.  30 capsule  11  . furosemide (LASIX) 20 MG tablet Take 1 tablet (20 mg  total) by mouth 2 (two) times daily.  60 tablet  11  . glucosamine-chondroitin 500-400 MG tablet Take 1 tablet by mouth once.        . metoprolol (LOPRESSOR) 50 MG tablet Take 2 tablets (100 mg total) by mouth 2 (two) times daily.  60 tablet  6  . Omega-3 Fatty Acids (FISH OIL) 1200 MG CAPS Take by mouth daily.        . potassium chloride (K-DUR) 10 MEQ tablet Take 2 tablets (20 mEq total) by mouth 2 (two) times daily.  120 tablet  6  . Saw Palmetto 450 MG CAPS Take by mouth daily.        Marland Kitchen Specialty Vitamins Products (A THRU Z ADVANTAGE) TABS Take by mouth daily.        Marland Kitchen terazosin (HYTRIN) 10 MG capsule Take 10 mg by mouth at bedtime.        .  metoprolol (LOPRESSOR) 50 MG tablet Take 2 tablet (50 mg total) by mouth 2 (two) times daily.  60 tablet  6     Review of Systems  Constitutional: Positive for fatigue.  HENT: Negative.   Eyes: Negative.   Cardiovascular:  Positive for leg swelling.  Gastrointestinal: Negative.   Musculoskeletal: Positive for gait problem.  Skin: Negative.   Neurological: Positive for weakness.  Hematological: Negative.   Psychiatric/Behavioral: Negative.   All other systems reviewed and are negative.    BP 146/82  Pulse 99  Ht 6' (1.829 m)  Wt 202 lb (91.627 kg)  BMI 27.40 kg/m2  Physical Exam  Nursing note and vitals reviewed. Constitutional: He is oriented to person, place, and time. He appears well-developed and well-nourished.  HENT:  Head: Normocephalic.  Nose: Nose normal.  Mouth/Throat: Oropharynx is clear and moist.  Eyes: Conjunctivae are normal. Pupils are equal, round, and reactive to light.  Neck: Normal range of motion. Neck supple. No JVD present.  Cardiovascular: Regular rhythm, S1 normal, S2 normal, normal heart sounds and intact distal pulses.  Tachycardia present.  Exam reveals no gallop and no friction rub.   No murmur heard.      Trace edema above the sock line and in the ankles.  Pulmonary/Chest: Effort normal and breath  sounds normal. No respiratory distress. He has no wheezes. He has no rales. He exhibits no tenderness.  Abdominal: Soft. Bowel sounds are normal. He exhibits no distension. There is no tenderness.  Musculoskeletal: Normal range of motion. He exhibits no edema and no tenderness.  Lymphadenopathy:    He has no cervical adenopathy.  Neurological: He is alert and oriented to person, place, and time. Coordination normal.  Skin: Skin is warm and dry. No rash noted. No erythema.  Psychiatric: He has a normal mood and affect. His behavior is normal. Judgment and thought content normal.           Assessment and Plan

## 2011-03-31 NOTE — Assessment & Plan Note (Signed)
Cholesterol is at goal on the current lipid regimen. No changes to the medications were made.  

## 2011-03-31 NOTE — Assessment & Plan Note (Signed)
We have suggested that he take Lasix once daily, no Lasix for additional weight loss more than 3 pounds, increase Lasix to b.i.d. For weight increases more than 3 pounds. Weight in the office is approximately 202 pounds.

## 2011-03-31 NOTE — Assessment & Plan Note (Addendum)
It would appear that he continues in atrial flutter despite our attempt at cardioversion. His rate has decreased from 135 beats per minute to the high 90s, his edema has improved. He does report increased fatigue though I suspect this could be from higher dose metoprolol. We will decrease his metoprolol to 50 mg b.i.d. If he continues to have fatigue, we will decrease the dose to 25 mg b.i.d.  We did discuss anticoagulation and he is not particularly interested in warfarin. He is a high fall risk.

## 2011-04-01 ENCOUNTER — Ambulatory Visit: Payer: Medicare Other | Admitting: Cardiovascular Disease

## 2011-04-07 ENCOUNTER — Telehealth: Payer: Self-pay | Admitting: *Deleted

## 2011-04-07 NOTE — Telephone Encounter (Signed)
Spoke to pt's daughter. Pt's HR trending down along with symptoms of fatigue. 8/17 HR 88, 8/18 Hr 92, 8/20 HR 64, 8/22 HR 58, 8/23 HR 50-51. His BP is stable ~130s/60s consistently. Advised pt cut metoprolol in half (25mg ) as instructed in last office note, continue twice daily as well as diltiazem 120 daily. Advised pt call me tomorrow afternoon if HR not improved after 2 doses of decr metoprolol, and/or fatigue worsens. Pt and daughter ok with this, will forward to Dr. Mariah Milling.

## 2011-04-08 ENCOUNTER — Telehealth: Payer: Self-pay | Admitting: *Deleted

## 2011-04-08 NOTE — Telephone Encounter (Signed)
Pt's daughter called stating that pt's HR has now decr to low 50s, currently 50bpm. Per last phone note, pt recently decr metop tartrate to 25 bid when his HR decreased to 50s as instructed, and no change with decr. Discussed with Dr. Mariah Milling, per his instruction I notified pt's daughter/and Morrie Sheldon, RN at [facility] to have pt hold his diltiazem 120mg , continue metoprolol 25mg  bid for HR >65. If HR <65, hold everything. If taking metop 25 and HR continues to incr, then he can incr to 50mg . Worst case, throughout weekend, if on metop 50 bid and HR still uncontrolled, they can add diltiazem back once. Pt will come in Monday for EKG to confirm rhythm. Pt may be back in NSR. Faxed instructions also to pt's daughter and RN at [facility].

## 2011-04-11 ENCOUNTER — Ambulatory Visit (INDEPENDENT_AMBULATORY_CARE_PROVIDER_SITE_OTHER): Payer: Medicare Other | Admitting: *Deleted

## 2011-04-11 ENCOUNTER — Encounter: Payer: Self-pay | Admitting: *Deleted

## 2011-04-11 DIAGNOSIS — I4892 Unspecified atrial flutter: Secondary | ICD-10-CM

## 2011-04-11 DIAGNOSIS — R609 Edema, unspecified: Secondary | ICD-10-CM

## 2011-04-11 NOTE — Progress Notes (Signed)
Pt in today for EKG, recently in a flutter, s/p cardioversion with continue fast HR, possible a flutter as well. Dr. Mariah Milling started pt on diltiazem at last ov. Regarding last 2 phone notes, pt's HR has continued to decrease on both metoprolol and diltiazem. Per Dr. Mariah Milling, we have had pt hold his diltiazem and decr metoprolol to 25mg  bid. He has brought in BP/HR readings to be scanned that show his HR has improved after stopping diltiazem. EKG today shows NSR HR 74. Pt has no complaints at this time. Advised pt to continue to hold diltiazem, as this may not be needed after he has converted to NSR. Advised pt to continue metoprolol as previously instructed and to continue to monitor HR and call back this Friday with report. Will also discuss with Dr. Mariah Milling next week when he returns to office as to when pt needs to f/u if his HR remains stable on new dosage. Gave pt instructions for metoprolol as:   Continue to hold Diltiazem.  Take Metoprolol 25mg  TWICE daily for heart rate > 65. If heart rate <65, hold metoprolol. If heart rate still elevated after taking 25mg , you may take another 25mg . Please call the office if HR stays elevated on current dose. Please record BP/HR this week and call office on Friday with results.

## 2011-04-11 NOTE — Patient Instructions (Signed)
Continue to hold Diltiazem.  Take Metoprolol 25mg  TWICE daily for heart rate > 65. If heart rate <65, hold metoprolol. If heart rate still elevated after taking 25mg , you may take another 25mg . Please call the office if HR stays elevated on current dose. Please record BP/HR this week and call office on Friday with results.  Please weigh yourself every other day. If your weight is less than 198lb hold the lasix  If your weight is greater than 203lb take lasix twice a day  If your weight is stable (198 to 202), take lasix one a day  Please call us if you have new issues that need to be addressed before your next appt.

## 2011-04-15 ENCOUNTER — Telehealth: Payer: Self-pay | Admitting: *Deleted

## 2011-04-15 NOTE — Telephone Encounter (Signed)
Pt called to give update after NSR shown on EGK 1 week prior. He has continued to take only metop tart 25mg  bid according to parameters given, and we wanted him to f/u to make sure HR stayed consistent with this dosage. He reports HR in 60s mostly, sometimes 72-74 but this was after some activity. Pt's BP stable at 110-130s/50s-60s. He reports that his energy has improved. I advised pt to continue metoprolol tartrate 25mg  bid and will forward to Dr. Mariah Milling.

## 2011-04-22 DIAGNOSIS — I4891 Unspecified atrial fibrillation: Secondary | ICD-10-CM

## 2011-04-22 DIAGNOSIS — R55 Syncope and collapse: Secondary | ICD-10-CM

## 2011-04-23 ENCOUNTER — Encounter: Payer: Self-pay | Admitting: Internal Medicine

## 2011-04-24 ENCOUNTER — Encounter: Payer: Self-pay | Admitting: Internal Medicine

## 2011-04-26 ENCOUNTER — Telehealth: Payer: Self-pay

## 2011-04-26 NOTE — Telephone Encounter (Signed)
Patient having increased blood pressure & nausea.  The daughter was told he would be discharged today & would possibly have home health to come to his house.  The daughter is very concerned & would like you to call her @ [phone].

## 2011-04-28 ENCOUNTER — Encounter: Payer: Self-pay | Admitting: *Deleted

## 2011-05-05 ENCOUNTER — Encounter: Payer: Self-pay | Admitting: Cardiovascular Disease

## 2011-05-05 ENCOUNTER — Ambulatory Visit (INDEPENDENT_AMBULATORY_CARE_PROVIDER_SITE_OTHER): Payer: Medicare Other | Admitting: Cardiovascular Disease

## 2011-05-05 VITALS — BP 155/71 | HR 60 | Resp 18 | Ht 73.0 in | Wt 197.8 lb

## 2011-05-05 DIAGNOSIS — G20A1 Parkinson's disease without dyskinesia, without mention of fluctuations: Secondary | ICD-10-CM

## 2011-05-05 DIAGNOSIS — I4891 Unspecified atrial fibrillation: Secondary | ICD-10-CM

## 2011-05-05 DIAGNOSIS — E785 Hyperlipidemia, unspecified: Secondary | ICD-10-CM

## 2011-05-05 DIAGNOSIS — I495 Sick sinus syndrome: Secondary | ICD-10-CM

## 2011-05-05 DIAGNOSIS — G20 Parkinson's disease: Secondary | ICD-10-CM

## 2011-05-05 DIAGNOSIS — R609 Edema, unspecified: Secondary | ICD-10-CM

## 2011-05-05 DIAGNOSIS — I10 Essential (primary) hypertension: Secondary | ICD-10-CM

## 2011-05-05 MED ORDER — AMIODARONE HCL 200 MG PO TABS: 200.0000 mg | ORAL_TABLET | Freq: Two times a day (BID) | ORAL | Status: DC

## 2011-05-05 NOTE — Patient Instructions (Signed)
Please stop the metoprolol Please start amiodarone 200 mg twice a day (for arrhythmia) Please continue hydralazine and norvasc for blood pressure  Please call us if you have new issues that need to be addressed before your next appt.  We will call you for a follow up Appt. In 3 months

## 2011-05-05 NOTE — Progress Notes (Signed)
Patient ID: Johnny Wright, male    DOB: 12-Oct-1920, 77 y.o.   MRN: [id number]  HPI Comments: [name] is a very pleasant 77 year old gentleman with a history of numerous TIAs over the past 7 years,  history of stroke in 2008 per his report, hyperlipidemia, hypertension, Recent onset of  atrial flutter, Status post TEE and cardioversion at the end of June which was unsuccessful in restoring normal sinus rhythm, though though did convert him to a slower atrial flutter with ventricular rate 98-100 beats per minute, presenting to [facility] With syncope, atrial fibrillation with RVR, found to have likely sick sinus syndrome with bradycardia on beta blockers, started on amiodarone b.i.d. With improvement of his rhythm, discharge to rehabilitation at [facility]. He now presents for routine followup.  Overall, he reports that he feels a little bit weaker this week. He uses a wheelchair some of the time. He has not been strong enough on a regular basis to use his walker which is what he was using prior to his recent hospitalization. Review of his medications showed that he was not continued on amiodarone b.i.d. As we had suggested, metoprolol had been reinitiated after it was stopped in the hospital for significant bradycardia. On the metoprolol, there was concern he would need a pacemaker. On amiodarone alone, his rhythm had been performing well with no atrial flutter or atrial fibrillation and no bradycardia.  He does have mild swelling in his lower extremities. He denies any shortness of breath. No significant weight gain.   EKG shows Sinus rhythm with rate of 61 beats per minute    Outpatient Encounter Prescriptions as of 05/05/2011  Medication Sig Dispense Refill  . acetaminophen (TYLENOL) 325 MG tablet Take 650 mg by mouth every 6 (six) hours as needed.        Marland Kitchen amLODipine (NORVASC) 5 MG tablet Take 5 mg by mouth 2 (two) times daily.        Marland Kitchen aspirin (ASPIR-81) 81 MG EC tablet Take 81 mg by  mouth every other day.        Marland Kitchen atorvastatin (LIPITOR) 20 MG tablet Take 10 mg by mouth daily.        . carbidopa-levodopa (SINEMET CR) 50-200 MG per tablet Take 1 tablet by mouth 3 (three) times daily. 10am, 2pm, 5pm      . carbidopa-levodopa (SINEMET) 25-100 MG per tablet Take 1 tablet by mouth 2 (two) times daily. 7am and 8pm       . clopidogrel (PLAVIX) 75 MG tablet Take 75 mg by mouth daily.        . diclofenac (FLECTOR) 1.3 % PTCH Place 1 patch onto the skin as needed.       . diclofenac (VOLTAREN) 0.1 % ophthalmic solution 1 drop 4 (four) times daily.        . furosemide (LASIX) 20 MG tablet Take 20 mg by mouth daily.        . hydrALAZINE (APRESOLINE) 50 MG tablet Take 50 mg by mouth 4 (four) times daily. Hold for systolic pressure LESS than 150       . Multiple Vitamins-Minerals (MULTIVITAL) tablet Take 1 tablet by mouth daily.        . Omega-3 Fatty Acids (FISH OIL) 1200 MG CAPS Take by mouth daily.        . pantoprazole (PROTONIX) 40 MG tablet Take 40 mg by mouth daily.        . potassium chloride (K-DUR) 10 MEQ tablet Take 10  mEq by mouth 2 (two) times daily.        . Saw Palmetto 450 MG CAPS Take by mouth daily.        Marland Kitchen terazosin (HYTRIN) 10 MG capsule Take 10 mg by mouth at bedtime.        .  metoprolol tartrate (LOPRESSOR) 12.5 mg TABS Take 12.5 mg by mouth daily.        Marland Kitchen Specialty Vitamins Products (A THRU Z ADVANTAGE) TABS Take by mouth daily.        Marland Kitchen DISCONTD: glucosamine-chondroitin 500-400 MG tablet Take 1 tablet by mouth once.           Review of Systems  Constitutional: Positive for fatigue.  HENT: Negative.   Eyes: Negative.   Cardiovascular: Positive for leg swelling.  Gastrointestinal: Negative.   Musculoskeletal: Positive for gait problem.  Skin: Negative.   Neurological: Positive for weakness.  Hematological: Negative.   Psychiatric/Behavioral: Negative.   All other systems reviewed and are negative.    BP 155/71  Pulse 60  Resp 18  Ht 6\' 1"  (1.854  m)  Wt 197 lb 12.8 oz (89.721 kg)  BMI 26.10 kg/m2  Physical Exam  Nursing note and vitals reviewed. Constitutional: He is oriented to person, place, and time. He appears well-developed and well-nourished.  HENT:  Head: Normocephalic.  Nose: Nose normal.  Mouth/Throat: Oropharynx is clear and moist.  Eyes: Conjunctivae are normal. Pupils are equal, round, and reactive to light.  Neck: Normal range of motion. Neck supple. No JVD present.  Cardiovascular: Regular rhythm, S1 normal, S2 normal and intact distal pulses.  Tachycardia present.  Exam reveals no gallop and no friction rub.   Murmur heard.  Crescendo systolic murmur is present with a grade of 2/6       Trace edema above the sock line and in the ankles.  Pulmonary/Chest: Effort normal and breath sounds normal. No respiratory distress. He has no wheezes. He has no rales. He exhibits no tenderness.  Abdominal: Soft. Bowel sounds are normal. He exhibits no distension. There is no tenderness.  Musculoskeletal: Normal range of motion. He exhibits no edema and no tenderness.  Lymphadenopathy:    He has no cervical adenopathy.  Neurological: He is alert and oriented to person, place, and time. Coordination normal.  Skin: Skin is warm and dry. No rash noted. No erythema.  Psychiatric: He has a normal mood and affect. His behavior is normal. Judgment and thought content normal.           Assessment and Plan

## 2011-05-05 NOTE — Assessment & Plan Note (Signed)
He has underlying sick sinus syndrome with atrial fibrillation, atrial flutter over the past several months, significant bradycardia on metoprolol. We will hold the metoprolol as we were doing in the hospital, reinitiate amiodarone 200 mg b.i.d.. He was evaluated by Dr. Johney Frame of [facility] while in the hospital and this was the regiment was initiated on the  effort to hold off on a pacemaker. My hope would be that in several weeks' time, we could decrease the amiodarone to 200 mg daily if he has no arrhythmia.

## 2011-05-05 NOTE — Assessment & Plan Note (Signed)
Continue statin, Lipitor and aspirin.

## 2011-05-05 NOTE — Assessment & Plan Note (Signed)
Edema is likely secondary to calcium channel blocker, amlodipine. It is mild. We will not change the medication at this time as his blood pressure is periodically elevated.

## 2011-05-05 NOTE — Assessment & Plan Note (Signed)
He is seen by Dr. Sandria Manly. He does report breakthrough fatigue. Uncertain if this is from the metoprolol or arrhythmia that he could be having since his discharge. We will reinitiate the amiodarone for his arrhythmia. If he continues to have fatigue, we have suggested he talk with Dr. Randa Lynn and Dr. Sandria Manly about his Parkinson's medication and dose.

## 2011-05-05 NOTE — Assessment & Plan Note (Signed)
He has amlodipine and hydralazine at [facility]. Recently, blood pressure with this regiment has been better controlled with systolic pressures in the 120 range.

## 2011-05-11 ENCOUNTER — Encounter: Payer: Self-pay | Admitting: Cardiovascular Disease

## 2011-05-12 ENCOUNTER — Telehealth: Payer: Self-pay | Admitting: *Deleted

## 2011-05-12 ENCOUNTER — Encounter: Payer: Medicare Other | Admitting: *Deleted

## 2011-05-12 DIAGNOSIS — R55 Syncope and collapse: Secondary | ICD-10-CM

## 2011-05-12 DIAGNOSIS — I4892 Unspecified atrial flutter: Secondary | ICD-10-CM

## 2011-05-12 DIAGNOSIS — R001 Bradycardia, unspecified: Secondary | ICD-10-CM

## 2011-05-12 NOTE — Telephone Encounter (Signed)
Spoke to [employer], Charity fundraiser at [employer]. Pt's HR this AM 74-76 at rest, after any activity hr drops. At 10:00 am he walked to bathroom, and came back c/o dizziness and HR was 44. Pt's metoprolol has been d/c-ed and Amiodarone decr to 200mg  once daily, verbal order given to [facility] to restart Amio at this dose tomorrow 05/13/11. Per Dr. Mariah Milling, pt will come in this afternoon to have holter monitor placed to evaluate how often bradycardic, possible need for pacemaker in near future. Pt's daughter aware of the above, she will bring pt today.

## 2011-05-19 ENCOUNTER — Telehealth: Payer: Self-pay | Admitting: *Deleted

## 2011-05-19 NOTE — Telephone Encounter (Signed)
Spoke to pt's daughter, notified of holter monitor results per Dr. Mariah Milling showing bradycardia when holter placed for 1 hr, hr to 39 bpm. NSR with rare PVCs, rare short run of SVT (5 beats). Rare pauses after SVT, up to 2.1 seconds. For most of 48 hrs rate >60 bpm. Notified we will continue Amiodarone 200mg  qd and if he continues to have slow rhythms, or fast, pt will need pacer per TG. Faxed results to PCP, Dr. Randa Lynn and to daughter per her request.

## 2011-05-26 ENCOUNTER — Telehealth: Payer: Self-pay

## 2011-05-26 NOTE — Telephone Encounter (Signed)
Daughter Geoffery Spruce) calling regarding Mr. Million heart rate running too low.  The 1st shift nurse had sent some pulse readings that are running too low.  The daughter is asking since heart rate running from too high to too low is there an adjustment that should be made in medications or is the next step for a pacemaker placement. The patient also has some questions for Dr. Mariah Milling about pacemaker as well.  Please call her at [phone] or her cell [phone]. Please call with some suggestions.

## 2011-05-27 NOTE — Telephone Encounter (Signed)
Notified patient's daughter regarding patient getting a pacemaker.  The daughter will talk to family members over the weekend and call us back for an appointment to discuss with Dr. Graciela Husbands since Dr. Johney Frame does not come to [facility].

## 2011-05-27 NOTE — Telephone Encounter (Signed)
He will likely need a pacemaker as he is having symptoms from his low heart rate. We need to schedule a pacer with Dr. Johney Frame. Will ask Dr. Johney Frame if he would like to see him first or just come in for pacer. He is currently on amio 200 mg once a day. Unable to toleratye metoprolol (brady) Hx of a flutter and fib, needs amio

## 2011-06-07 ENCOUNTER — Telehealth: Payer: Self-pay | Admitting: Cardiovascular Disease

## 2011-06-07 NOTE — Telephone Encounter (Signed)
Pt daughter calling to discuss the pacemaker with nurse. Please call pt daughter [phone] ext [phone]

## 2011-06-09 ENCOUNTER — Encounter: Payer: Self-pay | Admitting: *Deleted

## 2011-06-09 ENCOUNTER — Ambulatory Visit (INDEPENDENT_AMBULATORY_CARE_PROVIDER_SITE_OTHER): Payer: Medicare Other | Admitting: Internal Medicine

## 2011-06-09 ENCOUNTER — Encounter: Payer: Self-pay | Admitting: Internal Medicine

## 2011-06-09 DIAGNOSIS — IMO0002 Reserved for concepts with insufficient information to code with codable children: Secondary | ICD-10-CM

## 2011-06-09 DIAGNOSIS — I495 Sick sinus syndrome: Secondary | ICD-10-CM

## 2011-06-09 DIAGNOSIS — R001 Bradycardia, unspecified: Secondary | ICD-10-CM

## 2011-06-09 DIAGNOSIS — R55 Syncope and collapse: Secondary | ICD-10-CM

## 2011-06-09 DIAGNOSIS — I4892 Unspecified atrial flutter: Secondary | ICD-10-CM

## 2011-06-09 DIAGNOSIS — G459 Transient cerebral ischemic attack, unspecified: Secondary | ICD-10-CM

## 2011-06-09 DIAGNOSIS — I4891 Unspecified atrial fibrillation: Secondary | ICD-10-CM

## 2011-06-09 DIAGNOSIS — I498 Other specified cardiac arrhythmias: Secondary | ICD-10-CM

## 2011-06-09 DIAGNOSIS — Z01810 Encounter for preprocedural cardiovascular examination: Secondary | ICD-10-CM

## 2011-06-09 DIAGNOSIS — R609 Edema, unspecified: Secondary | ICD-10-CM

## 2011-06-09 NOTE — Assessment & Plan Note (Signed)
The patient has bradycardia with junctional escapes. This is been a problem than with rehabilitation. The other issue is that his amiodarone continues to chew related to bradycardia issue will worsen. I agree with Dr. Windell Hummingbird recommendation for pacing. I have reviewed with the family the potential risks and benefits including but not limited to death, perforation, infection, lead dislodgment, and device malfunction.  This will allow the use of Augmented rate control. Specifically, rhythm control might be exchanged for rate control with metoprolol thereby avoiding the side effects of amiodarone which I reviewed with the family today

## 2011-06-09 NOTE — Telephone Encounter (Signed)
Pt and daughter in today to see Dr. Graciela Husbands.

## 2011-06-09 NOTE — Patient Instructions (Signed)

## 2011-06-09 NOTE — Assessment & Plan Note (Signed)
As above.

## 2011-06-09 NOTE — Progress Notes (Signed)
Patient ID: Johnny Wright, male    DOB: 01/16/1921, 77 y.o.   MRN: [id number]  [name] is seen at the request of Dr. Mariah Milling for consideration of pacemaker implantation.  He has a poorly clarified remote history, but has had recurrent documented atrial fibrillation/flutter over the summer with heart rates up into the 200 range and at least one episode of associated syncope. He underwent TEE guided cardioversion on at least one occasion. He has had recurrent arrhythmia notwithstanding.  Unfortunately post conversion bradycardia has been problematic. It prompted the discontinuation of metoprolol. There has been effort notwithstanding the bradycardia to use amiodarone for rhythm control. Unfortunately he has had progressive bradycardia and has had heart rates documented in the 30s and 40s in sinus rhythm. He's also had junctional rhythm as an escape rate. This has had a major impact on his rehabilitation which is important given his underlying Parkinson's disease. Physical therapy has actually been largely withheld because of bradycardia.  Thromboembolic risk factors are normal for age-43 hypertension-one prior stroke-2 for a CHADS VASC score of 5 and a [place] score of 4. He has been managed for many years with a combination of aspirin and Plavix.    He also had problems with peripheral edema which has responded to diuretics.     Current Outpatient Prescriptions  Medication Sig Dispense Refill  . acetaminophen (TYLENOL) 325 MG tablet Take 650 mg by mouth every 6 (six) hours as needed.        Marland Kitchen amiodarone (PACERONE) 200 MG tablet Take 200 mg by mouth daily.        Marland Kitchen amLODipine (NORVASC) 5 MG tablet Take 5 mg by mouth 2 (two) times daily.        Marland Kitchen aspirin (ASPIR-81) 81 MG EC tablet Take 81 mg by mouth every other day.        Marland Kitchen atorvastatin (LIPITOR) 20 MG tablet Take 10 mg by mouth daily.        . carbidopa-levodopa (SINEMET CR) 50-200 MG per tablet Take 1 tablet by mouth 3 (three) times daily.  10am, 2pm, 5pm      . carbidopa-levodopa (SINEMET) 25-100 MG per tablet Take 1 tablet by mouth 2 (two) times daily. 7am and 8pm       . clopidogrel (PLAVIX) 75 MG tablet Take 75 mg by mouth daily.        . diclofenac (FLECTOR) 1.3 % PTCH Place 1 patch onto the skin as needed.       . diclofenac (VOLTAREN) 0.1 % ophthalmic solution 1 drop 4 (four) times daily.        . furosemide (LASIX) 20 MG tablet Take 20 mg by mouth daily.        . hydrALAZINE (APRESOLINE) 50 MG tablet Take 50 mg by mouth 4 (four) times daily. Hold for systolic pressure LESS than 150       . Multiple Vitamins-Minerals (MULTIVITAL) tablet Take 1 tablet by mouth daily.        . Omega-3 Fatty Acids (FISH OIL) 1200 MG CAPS Take by mouth daily.        . pantoprazole (PROTONIX) 40 MG tablet Take 40 mg by mouth daily.        . potassium chloride (K-DUR) 10 MEQ tablet Take 10 mEq by mouth 2 (two) times daily.        . Saw Palmetto 450 MG CAPS Take by mouth 2 (two) times daily.       Marland Kitchen Specialty Vitamins Products (A  THRU Z ADVANTAGE) TABS Take by mouth daily.        Marland Kitchen terazosin (HYTRIN) 10 MG capsule Take 10 mg by mouth at bedtime.          No Known Allergies  Past Medical History  Diagnosis Date  . Arthritic-like pain   . Cerebrovascular disease   . Hyperlipidemia   . HTN (hypertension)   . Parkinson's disease     early stages    Past Surgical History  Procedure Date  . Appendectomy 1945  . Cervical disc surgery 1970  . Cataract extraction 2005    Family History  Problem Relation Age of Onset  . Lung cancer Brother     History   Social History  . Marital Status: Married    Spouse Name: N/A    Number of Children: N/A  . Years of Education: N/A   Occupational History  . Not on file.   Social History Main Topics  . Smoking status: Former Games developer  . Smokeless tobacco: Not on file  . Alcohol Use: Yes  . Drug Use: No  . Sexually Active:    Other Topics Concern  . Not on file   Social History Narrative    Retired. Regularly exercises.     Fourteen point review of systems was negative except as noted in HPI and PMH   PHYSICAL EXAMINATION  Blood pressure 128/62, pulse 68, height 6\' 1"  (1.854 m), weight 202 lb (91.627 kg).   Well developed and nourished older Caucasian male sitting in a wheelchair in no acute distress HENT normal Neck supple with JVP-flat Carotids brisk and full without bruits Back without scoliosis or kyphosis Clear Regular rate and rhythm, no murmurs or gallops Abd-soft with active BS without hepatomegaly or midline pulsation Femoral pulses 2+ distal pulses intact No Clubbing cyanosis 2+ edema Skin-warm and dry LN-neg submandibular and supraclavicular A & Oriented CN 3-12 normal  Grossly normal sensory and motor function Affect engaging . Sinus rhythm at 68 Electrocardiogram September 26 sinus rhythm with sinus arrest and junctional escapes A cardiogram 7 September sinus bradycardia heart rate 43 Holter monitor junctional rhythm as well as sinus bradycardia and some posttermination pauses

## 2011-06-09 NOTE — Assessment & Plan Note (Signed)
The patient had recurrent documented atrial fibrillation/flutter with heart rates up to the 200 range. There was syncope associated with one of these episodes. He also has post termination pauses which might have contributed. Clearly control of tachycardia is essential.  The other issue is thromboembolic risk reduction. He is on a combination of aspirin and Plavix and I would recommend consideration of Coumadin as an alternative given its marked increase in efficacy as demonstrated in Active trial

## 2011-06-09 NOTE — Assessment & Plan Note (Signed)
Improved, continue diuretics.

## 2011-06-09 NOTE — Assessment & Plan Note (Signed)
As noted above this could either been from his tachycardia or posterior lesion pausing. In either case rate control and backup bradycardia pacing is essential.

## 2011-06-10 LAB — CBC WITH DIFFERENTIAL
Basophils Absolute: 0 10*3/uL (ref 0.0–0.2)
Eosinophils Absolute: 0.2 10*3/uL (ref 0.0–0.4)
HCT: 34.7 % (ref 34.0–46.6)
Lymphocytes Absolute: 1 10*3/uL (ref 0.7–4.5)
MCH: 31.4 pg (ref 26.6–33.0)
MCHC: 33.4 g/dL (ref 31.5–35.7)
MCV: 94 fL (ref 79–97)
Monocytes Absolute: 1 10*3/uL (ref 0.1–1.0)
Neutrophils Absolute: 6.4 10*3/uL (ref 1.8–7.8)
RDW: 15.2 % (ref 12.3–15.4)

## 2011-06-10 LAB — BASIC METABOLIC PANEL
Calcium: 9 mg/dL (ref 8.6–10.2)
GFR calc Af Amer: 43 mL/min/{1.73_m2} — ABNORMAL LOW (ref >59)
GFR calc non Af Amer: 38 mL/min/{1.73_m2} — ABNORMAL LOW (ref >59)
Potassium: 3.8 mmol/L (ref 3.5–5.2)
Sodium: 139 mmol/L (ref 134–144)

## 2011-06-13 ENCOUNTER — Ambulatory Visit (HOSPITAL_COMMUNITY)
Admission: RE | Admit: 2011-06-13 | Discharge: 2011-06-14 | Disposition: A | Discharge status: Home / Self Care | Payer: Medicare Other | Source: Ambulatory Visit | Attending: Internal Medicine | Admitting: Internal Medicine

## 2011-06-13 ENCOUNTER — Ambulatory Visit (HOSPITAL_COMMUNITY): Payer: Medicare Other

## 2011-06-13 DIAGNOSIS — R55 Syncope and collapse: Secondary | ICD-10-CM | POA: Insufficient documentation

## 2011-06-13 DIAGNOSIS — Z8673 Personal history of transient ischemic attack (TIA), and cerebral infarction without residual deficits: Secondary | ICD-10-CM | POA: Insufficient documentation

## 2011-06-13 DIAGNOSIS — Z01818 Encounter for other preprocedural examination: Secondary | ICD-10-CM | POA: Insufficient documentation

## 2011-06-13 DIAGNOSIS — Z950 Presence of cardiac pacemaker: Secondary | ICD-10-CM

## 2011-06-13 DIAGNOSIS — R609 Edema, unspecified: Secondary | ICD-10-CM | POA: Insufficient documentation

## 2011-06-13 DIAGNOSIS — I495 Sick sinus syndrome: Secondary | ICD-10-CM | POA: Insufficient documentation

## 2011-06-13 DIAGNOSIS — I4891 Unspecified atrial fibrillation: Secondary | ICD-10-CM | POA: Insufficient documentation

## 2011-06-13 DIAGNOSIS — G20 Parkinson's disease: Secondary | ICD-10-CM | POA: Insufficient documentation

## 2011-06-13 DIAGNOSIS — Z01811 Encounter for preprocedural respiratory examination: Secondary | ICD-10-CM

## 2011-06-13 DIAGNOSIS — G20A1 Parkinson's disease without dyskinesia, without mention of fluctuations: Secondary | ICD-10-CM | POA: Insufficient documentation

## 2011-06-13 DIAGNOSIS — E785 Hyperlipidemia, unspecified: Secondary | ICD-10-CM | POA: Insufficient documentation

## 2011-06-13 DIAGNOSIS — I10 Essential (primary) hypertension: Secondary | ICD-10-CM | POA: Insufficient documentation

## 2011-06-13 LAB — SURGICAL PCR SCREEN: MRSA, PCR: NEGATIVE

## 2011-06-14 ENCOUNTER — Ambulatory Visit (HOSPITAL_COMMUNITY): Payer: Medicare Other

## 2011-06-17 NOTE — Op Note (Signed)
  NAMEBREYLAN, Johnny Wright NO.:  [id number]  MEDICAL RECORD NO.:  [id number]  LOCATION:                                 FACILITY:  PHYSICIAN:  Duke Salvia, MD, FACCDATE OF BIRTH:  1921-05-11  DATE OF PROCEDURE: DATE OF DISCHARGE:                              OPERATIVE REPORT   PREOPERATIVE DIAGNOSIS:  Tachy-brady syndrome.  POSTOPERATIVE DIAGNOSIS:  Tachy-brady syndrome.  PROCEDURE:  Dual-chamber pacemaker implantation.  Following obtaining informed consent, the patient was brought to Electrophysiology Laboratory and placed on the fluoroscopic table in supine position.  After routine prep and drape of the left upper chest, lidocaine was infiltrated in prepectoral subclavicular region.  An incision was made and carried down to the layer of the prepectoral fascia using electrocautery and sharp dissection.  A pocket was formed similarly.  Hemostasis was obtained.  Thereafter, attention was turned to gain access to the extrathoracic left subclavian vein, which was accomplished without difficulty without the aspiration of air or puncture of the artery.  Two separate venipunctures were accomplished.  Guidewires were placed and retained. Sequentially, 7-French sheaths were placed which were passed a St. Jude 1948 58-cm passive fixation ventricular lead, serial number [id number] and a Medtronic 5076 52-cm active fixation atrial lead, serial number [id number].  Under fluoroscopic guidance, these were manipulated to the right ventricular apex and the right atrial appendage respectively where the bipolar R-wave was 14 with a pace impedance of 833 and threshold of 0.4 at 0.5.  There is no diaphragmatic pacing at 10 V.  The bipolar P-wave was 3.4 with a pace impedance of 699, a threshold of 2.4 at 0.5.  Shortly after screw deployment, the current of injury was brisk.  There is no testing for diaphragmatic pacing.  With these acceptable parameters recorded, leads were  secured to the prepectoral fascia and then attached to Medtronic Adapta RL 1 pulse generator, serial number [id number].  A ventricular-based paced beat was identified.  The pocket was copiously irrigated with antibiotic- containing saline solution.  Hemostasis was assured.  The leads and pulse generator were placed in the pocket and secured to the prepectoral fascia.  The wound was then closed in 3 layers in normal fashion.  The wound was washed, dried, and a benzoin and Steri-Strip dressing was applied.  Needle counts, sponge counts, and instrument counts were correct at the end of the procedure.  Estimated blood loss was less than 20 mL.  No apparent complications.  The patient tolerated the procedure well.    Duke Salvia, MD, [facility]    SCK/MEDQ  D:  06/13/2011  T:  06/14/2011  Job:  [id number]  Electronically Signed by Sherryl Manges MD [facility] on 06/17/2011 06:03:44 PM

## 2011-06-23 ENCOUNTER — Ambulatory Visit (INDEPENDENT_AMBULATORY_CARE_PROVIDER_SITE_OTHER): Payer: Medicare Other | Admitting: *Deleted

## 2011-06-23 ENCOUNTER — Encounter: Payer: Self-pay | Admitting: Internal Medicine

## 2011-06-23 ENCOUNTER — Encounter: Payer: Self-pay | Admitting: Cardiovascular Disease

## 2011-06-23 DIAGNOSIS — I495 Sick sinus syndrome: Secondary | ICD-10-CM

## 2011-06-23 LAB — PACEMAKER DEVICE OBSERVATION
AL THRESHOLD: 1 V
BAMS-0001: 150 {beats}/min
RV LEAD AMPLITUDE: 31.36 mv
RV LEAD IMPEDENCE PM: 767 Ohm
RV LEAD THRESHOLD: 0.5 V
VENTRICULAR PACING PM: 0

## 2011-06-27 ENCOUNTER — Ambulatory Visit (INDEPENDENT_AMBULATORY_CARE_PROVIDER_SITE_OTHER): Payer: Medicare Other | Admitting: Cardiovascular Disease

## 2011-06-27 ENCOUNTER — Encounter: Payer: Self-pay | Admitting: Cardiovascular Disease

## 2011-06-27 DIAGNOSIS — R609 Edema, unspecified: Secondary | ICD-10-CM

## 2011-06-27 DIAGNOSIS — R001 Bradycardia, unspecified: Secondary | ICD-10-CM

## 2011-06-27 DIAGNOSIS — I498 Other specified cardiac arrhythmias: Secondary | ICD-10-CM

## 2011-06-27 DIAGNOSIS — I4892 Unspecified atrial flutter: Secondary | ICD-10-CM

## 2011-06-27 DIAGNOSIS — I10 Essential (primary) hypertension: Secondary | ICD-10-CM

## 2011-06-27 MED ORDER — AMLODIPINE BESYLATE 5 MG PO TABS: 5.0000 mg | ORAL_TABLET | Freq: Two times a day (BID) | ORAL | Status: DC

## 2011-06-27 NOTE — Assessment & Plan Note (Signed)
Pacemaker placed for sick sinus syndrome. He otherwise feels well.

## 2011-06-27 NOTE — Progress Notes (Signed)
Patient ID: Johnny Wright, male    DOB: 05-27-1921, 77 y.o.   MRN: [id number]  HPI Comments: [name] is a very pleasant 77 year old gentleman with a history of numerous TIAs over the past 7 years,  history of stroke in 2008 per his report, hyperlipidemia, hypertension, Recent onset of  atrial flutter, Status post TEE and cardioversion at the end of June 2012 which was unsuccessful in restoring normal sinus rhythm, though though did convert him to a slower atrial flutter with ventricular rate 98-100 beats per minute, presenting to [facility] With syncope, atrial fibrillation with RVR, found to have likely sick sinus syndrome with bradycardia on beta blockers, started on amiodarone b.i.d. With improvement of his rhythm, Though having episodes occur from bradycardia, discharge to rehabilitation at [facility] With continued symptoms, status post pacemaker 2 weeks ago by Dr. Graciela Husbands.  Since his pacemaker placement, he was started on diltiazem CD 120 mg daily and his amlodipine was decreased to 5 mg from 10 mg daily. He has had a 16 pound weight gain over the past 2 weeks and his edema has gone from mild to severe. He has redness of the legs, tightness and burning. He does have a mild wheeze with no significant cough No episodes of lightheadedness or dizziness.   EKG shows Paced rhythm with ventricular rate 73 beats per minute    Outpatient Encounter Prescriptions as of 06/27/2011  Medication Sig Dispense Refill  . acetaminophen (TYLENOL) 325 MG tablet Take 650 mg by mouth every 6 (six) hours as needed.        Marland Kitchen amiodarone (PACERONE) 200 MG tablet Take 200 mg by mouth at bedtime.       Marland Kitchen aspirin (ASPIR-81) 81 MG EC tablet Take 81 mg by mouth every other day.        Marland Kitchen atorvastatin (LIPITOR) 20 MG tablet Take 10 mg by mouth daily.        . carbidopa-levodopa (SINEMET CR) 50-200 MG per tablet Take 1 tablet by mouth 3 (three) times daily. 10am, 2pm, 5pm      . carbidopa-levodopa (SINEMET) 25-100 MG  per tablet Take 1 tablet by mouth 2 (two) times daily. 7am and 8pm       . clopidogrel (PLAVIX) 75 MG tablet Take 75 mg by mouth daily.        . diclofenac (FLECTOR) 1.3 % PTCH Place 1 patch onto the skin as needed.       . diclofenac (VOLTAREN) 0.1 % ophthalmic solution 1 drop 4 (four) times daily.        . furosemide (LASIX) 20 MG tablet Take 20 mg by mouth daily.        Marland Kitchen glucosamine-chondroitin 500-400 MG tablet Take 1 tablet by mouth daily.        Marland Kitchen guaiFENesin-dextromethorphan (ROBITUSSIN DM) 100-10 MG/5ML syrup Take 5 mLs by mouth 4 (four) times daily as needed.        . hydrALAZINE (APRESOLINE) 50 MG tablet Take 50 mg by mouth 4 (four) times daily. Hold for systolic pressure LESS than 150      . metoprolol (LOPRESSOR) 50 MG tablet Take 50 mg by mouth 2 (two) times daily.        . Multiple Vitamins-Minerals (MULTIVITAL) tablet Take 1 tablet by mouth daily.        . Omega-3 Fatty Acids (FISH OIL) 1200 MG CAPS Take 1,000 mg by mouth daily.       . pantoprazole (PROTONIX) 40 MG tablet Take  40 mg by mouth daily.        . polyethylene glycol powder (GLYCOLAX/MIRALAX) powder Take 17 g by mouth at bedtime.        . potassium chloride (K-DUR) 10 MEQ tablet Take 10 mEq by mouth 2 (two) times daily.        . Saw Palmetto 450 MG CAPS Take by mouth 2 (two) times daily.       Marland Kitchen senna (SENOKOT) 8.6 MG tablet Take 1 tablet by mouth 2 (two) times daily as needed.        Marland Kitchen  amLODipine (NORVASC) 5 MG tablet Take 5 mg by mouth daily.       Marland Kitchen diltiazem (CARDIZEM CD) 120 MG 24 hr capsule Take 120 mg by mouth daily.        Marland Kitchen DISCONTD: Specialty Vitamins Products (A THRU Z ADVANTAGE) TABS Take by mouth daily.        Marland Kitchen DISCONTD: terazosin (HYTRIN) 10 MG capsule Take 10 mg by mouth at bedtime.            Review of Systems  HENT: Negative.   Eyes: Negative.   Cardiovascular: Positive for leg swelling.  Gastrointestinal: Negative.   Musculoskeletal: Positive for gait problem.  Skin: Negative.     Neurological: Positive for weakness.  Hematological: Negative.   Psychiatric/Behavioral: Negative.   All other systems reviewed and are negative.    BP 132/60  Pulse 72  Ht 6\' 1"  (1.854 m)  Wt 224 lb 12.8 oz (101.969 kg)  BMI 29.66 kg/m2  Physical Exam  Nursing note and vitals reviewed. Constitutional: He is oriented to person, place, and time. He appears well-developed and well-nourished.  HENT:  Head: Normocephalic.  Nose: Nose normal.  Mouth/Throat: Oropharynx is clear and moist.  Eyes: Conjunctivae are normal. Pupils are equal, round, and reactive to light.  Neck: Normal range of motion. Neck supple. No JVD present.  Cardiovascular: Regular rhythm, S1 normal, S2 normal and intact distal pulses.  Tachycardia present.  Exam reveals no gallop and no friction rub.   Murmur heard.  Crescendo systolic murmur is present with a grade of 2/6       2+ edema to the knees  Pulmonary/Chest: Effort normal and breath sounds normal. No respiratory distress. He has no wheezes. He has no rales. He exhibits no tenderness.  Abdominal: Soft. Bowel sounds are normal. He exhibits no distension. There is no tenderness.  Musculoskeletal: Normal range of motion. He exhibits edema. He exhibits no tenderness.  Lymphadenopathy:    He has no cervical adenopathy.  Neurological: He is alert and oriented to person, place, and time. Coordination normal.  Skin: Skin is warm and dry. No rash noted. No erythema.  Psychiatric: He has a normal mood and affect. His behavior is normal. Judgment and thought content normal.           Assessment and Plan

## 2011-06-27 NOTE — Assessment & Plan Note (Addendum)
Blood pressure is well controlled on today's visit. Diltiazem was held secondary to edema. We will increase his amlodipine.

## 2011-06-27 NOTE — Patient Instructions (Addendum)
Please hold your cardizem/diltiazem Restart amlodipine 5 mg twice a day Please increase lasix to 20 mg twice a day for four days, then decrease to once a day  Please call us if you have new issues that need to be addressed before your next appt.  The office will contact you for a follow up Appt. In 1 months

## 2011-06-27 NOTE — Assessment & Plan Note (Signed)
Edema today is severe with a 16-17 pound weight gain. We have instructed him to decrease his fluid intake, take Lasix 20 mg b.i.d. For 4 days before decreasing to 20 mg daily. We will hold the Cardizem as we didn't have this problem with edema in the past on Cardizem. We will reinitiate amlodipine 5 mg b.i.d..   If he continues to have edema, we'll decrease the amlodipine.

## 2011-06-27 NOTE — Assessment & Plan Note (Signed)
He reports no palpitations or arrhythmia and otherwise feels well. We'll monitor his rhythm on the metoprolol and amiodarone.

## 2011-07-05 ENCOUNTER — Other Ambulatory Visit: Payer: Self-pay | Admitting: Cardiovascular Disease

## 2011-07-05 MED ORDER — AMIODARONE HCL 200 MG PO TABS: 200.0000 mg | ORAL_TABLET | Freq: Every day | ORAL | Status: DC

## 2011-07-05 NOTE — Telephone Encounter (Signed)
Refill sent for amiodarone 200 mg take one tablet daily. 

## 2011-07-05 NOTE — Telephone Encounter (Signed)
Pt needs this called in today so he will have meds for tonight. Pt was discharged from nursing facility today and has NO meds.

## 2011-07-21 ENCOUNTER — Encounter: Payer: Self-pay | Admitting: Cardiovascular Disease

## 2011-07-29 ENCOUNTER — Ambulatory Visit: Payer: Medicare Other | Admitting: Cardiovascular Disease

## 2011-09-16 ENCOUNTER — Ambulatory Visit (INDEPENDENT_AMBULATORY_CARE_PROVIDER_SITE_OTHER): Payer: Medicare Other | Admitting: Internal Medicine

## 2011-09-16 ENCOUNTER — Encounter: Payer: Self-pay | Admitting: Internal Medicine

## 2011-09-16 VITALS — BP 136/78 | HR 88 | Ht 73.0 in | Wt 204.0 lb

## 2011-09-16 DIAGNOSIS — I495 Sick sinus syndrome: Secondary | ICD-10-CM

## 2011-09-16 DIAGNOSIS — I498 Other specified cardiac arrhythmias: Secondary | ICD-10-CM

## 2011-09-16 DIAGNOSIS — R001 Bradycardia, unspecified: Secondary | ICD-10-CM

## 2011-09-16 DIAGNOSIS — I10 Essential (primary) hypertension: Secondary | ICD-10-CM

## 2011-09-16 DIAGNOSIS — I4892 Unspecified atrial flutter: Secondary | ICD-10-CM

## 2011-09-16 DIAGNOSIS — Z950 Presence of cardiac pacemaker: Secondary | ICD-10-CM

## 2011-09-16 LAB — PACEMAKER DEVICE OBSERVATION
AL IMPEDENCE PM: 473 Ohm
AL THRESHOLD: 0.5 V
BATTERY VOLTAGE: 2.8 V
RV LEAD AMPLITUDE: 16 mv
RV LEAD IMPEDENCE PM: 817 Ohm
VENTRICULAR PACING PM: 0

## 2011-09-16 NOTE — Assessment & Plan Note (Signed)
afib less  Frequent.  The issue of anticoagulation was broached again.  I think ASA/PLAXIX are insufficient  And have recommended warfarin  I dont know that we have data regarding safety in nonogenerians  I will have them review this with Dr Randa Lynn

## 2011-09-16 NOTE — Assessment & Plan Note (Signed)
The patient's device was interrogated.  The information was reviewed. No changes were made in the programming.    

## 2011-09-16 NOTE — Assessment & Plan Note (Signed)
With relatively low blood pressures, we will discontinue his amlodipine.  He will followup with dr Randa Lynn

## 2011-09-16 NOTE — Progress Notes (Signed)
HPI  Johnny Wright is a 78 y.o. male een in followup for tachybbrady syndrome with rapid afib and sinus brady for which he underwent pacing in 05/2011  CHADSVAsc score of 5;  He remains on ASA  And plavix  He is much improved since pacing and is now back in independent living  He and his daughter are very pleased with improvement  They bring in records of BP and HR which not infrequently are in the 90 and low 100s Past Medical History  Diagnosis Date  . Arthritic-like pain   . Cerebrovascular disease     Multiple TIAs/strokes  . Hyperlipidemia   . HTN (hypertension)   . Parkinson's disease     early stages  . Atrial fibrillation or flutter   . Sinus bradycardia   . Syncope   . Sinoatrial node dysfunction     Past Surgical History  Procedure Date  . Appendectomy 1945  . Cervical disc surgery 1970  . Cataract extraction 2005    Current Outpatient Prescriptions  Medication Sig Dispense Refill  . acetaminophen (TYLENOL) 325 MG tablet Take 650 mg by mouth every 6 (six) hours as needed.        Marland Kitchen amiodarone (PACERONE) 200 MG tablet Take 100 mg by mouth 2 (two) times daily.      Marland Kitchen amLODipine (NORVASC) 10 MG tablet Take 1/2 tablet twice a day.      Marland Kitchen aspirin (ASPIR-81) 81 MG EC tablet Take 81 mg by mouth every other day.        Marland Kitchen atorvastatin (LIPITOR) 20 MG tablet Take 10 mg by mouth daily.        . carbidopa-levodopa (SINEMET CR) 50-200 MG per tablet Take 1/2 tablet in the am 1/2 tablet 8 pm. Take one full tablet at 10 am, 2 pm & 5 pm.      . clopidogrel (PLAVIX) 75 MG tablet Take 75 mg by mouth daily.        . diclofenac (FLECTOR) 1.3 % PTCH Place 1 patch onto the skin as needed.       . diclofenac (VOLTAREN) 0.1 % ophthalmic solution 1 drop 4 (four) times daily.        . furosemide (LASIX) 20 MG tablet Take 20 mg by mouth 2 (two) times daily.       Marland Kitchen glucosamine-chondroitin 500-400 MG tablet Take 1 tablet by mouth daily.        . hydrALAZINE (APRESOLINE) 50 MG tablet Take  50 mg by mouth 4 (four) times daily. Hold for systolic pressure less than 150.      . metoprolol (LOPRESSOR) 50 MG tablet Take 50 mg by mouth 2 (two) times daily.        . Multiple Vitamins-Minerals (MULTIVITAL) tablet Take 1 tablet by mouth daily.        . Omega-3 Fatty Acids (FISH OIL) 1200 MG CAPS Take 1,000 mg by mouth daily.       . potassium chloride (K-DUR) 10 MEQ tablet Take 10 mEq by mouth 2 (two) times daily.        . Saw Palmetto 450 MG CAPS Take by mouth 2 (two) times daily.         Allergies  Allergen Reactions  . Diltiazem Hcl (XLK:GMWNUU Dye+Methylparaben+Diltiazem)      Severe lower extremity edema    Review of Systems negative except from HPI and PMH  Physical Exam BP 136/78  Pulse 88  Ht 6\' 1"  (1.854 m)  Wt 204 lb (  92.534 kg)  BMI 26.91 kg/m2 Well developed and well nourished in no acute distress HENT normal E scleral and icterus clear Neck Supple Pacer pocket well healed Clear to ausculation Regular rate and rhythm, no murmurs gallops or rub Soft with active bowel sounds No clubbing cyanosis none Edema Alert and oriented, grossly normal motor and sensory function Skin Warm and Dry   Assessment and  Plan

## 2011-09-16 NOTE — Assessment & Plan Note (Signed)
100% a pacing and feeling much better

## 2011-09-16 NOTE — Patient Instructions (Signed)
Your physician recommends that you schedule a follow-up appointment in: with Dr. Graciela Husbands 05/2012.

## 2011-10-18 ENCOUNTER — Telehealth: Payer: Self-pay | Admitting: Cardiovascular Disease

## 2011-10-18 NOTE — Telephone Encounter (Signed)
Per nurse she spoke with PCP/ dr lamb this afternoon and he was comfortable with morning and noon BP but wanted her to get afternoon BP which was higher, PCP had stopped his BP med's awhile ago due to hypotension. When she called back with later BP results Dr was gone, she spoke with on call Dr and he suggested to give him his norvasc. Nurse was unsure, she wanted Dr Mariah Milling opinion. Informed her that Dr Mariah Milling was with pts and It will be awhile till he reads messages. Med list we have is not current because PCP has been adjusting meds. Told her to take the advise from the on call dr she spoke with, continue to monitor and call pcp in am. She agreed to plan.

## 2011-10-18 NOTE — Telephone Encounter (Signed)
Nurse from [facility] calling wanting to discuss pt BP with nurse. Pt BP readings  Orthostatic prior to today have been normal. No symptoms other than low energy and legs [place] shaky. Having increased urination over last few days. Has a hx of prostate issues.  This am sitting 181/96 Standing 175/96 Noon- 200/100 3:50 pm  Sitting 223/118 Standing 202/118

## 2011-10-26 ENCOUNTER — Ambulatory Visit (INDEPENDENT_AMBULATORY_CARE_PROVIDER_SITE_OTHER): Payer: Medicare Other | Admitting: Cardiovascular Disease

## 2011-10-26 ENCOUNTER — Encounter: Payer: Self-pay | Admitting: Cardiovascular Disease

## 2011-10-26 VITALS — BP 190/104 | HR 70 | Ht 73.0 in | Wt 200.0 lb

## 2011-10-26 DIAGNOSIS — I498 Other specified cardiac arrhythmias: Secondary | ICD-10-CM

## 2011-10-26 DIAGNOSIS — I10 Essential (primary) hypertension: Secondary | ICD-10-CM

## 2011-10-26 DIAGNOSIS — E785 Hyperlipidemia, unspecified: Secondary | ICD-10-CM

## 2011-10-26 DIAGNOSIS — R001 Bradycardia, unspecified: Secondary | ICD-10-CM

## 2011-10-26 DIAGNOSIS — I4892 Unspecified atrial flutter: Secondary | ICD-10-CM

## 2011-10-26 DIAGNOSIS — R55 Syncope and collapse: Secondary | ICD-10-CM

## 2011-10-26 DIAGNOSIS — Z950 Presence of cardiac pacemaker: Secondary | ICD-10-CM

## 2011-10-26 NOTE — Assessment & Plan Note (Signed)
Paced rhythm. Appears stable.

## 2011-10-26 NOTE — Patient Instructions (Signed)
You are doing well. Please decrease the lasix to mon, wed, Friday, Sat or every other day Monitor your weight If weight stays stable, maybe wean down off the lasix  Take a lasix for weight gain of 3 lbs  Please call us if you have new issues that need to be addressed before your next appt.  Your physician wants you to follow-up in: 6 months.  You will receive a reminder letter in the mail two months in advance. If you don't receive a letter, please call our office to schedule the follow-up appointment.

## 2011-10-26 NOTE — Assessment & Plan Note (Signed)
History of sick sinus syndrome. Followed by Dr. Graciela Husbands.

## 2011-10-26 NOTE — Progress Notes (Signed)
Patient ID: Johnny Wright, male    DOB: 07/09/1921, 78 y.o.   MRN: [id number]  HPI Comments: Mr. Rylon Poitra is a very pleasant 78 year old gentleman with a history of numerous TIAs over the past 7 years,  history of stroke in 2008 per his report, hyperlipidemia, hypertension,  atrial flutter, Status post TEE and cardioversion at the end of June 2012 which was unsuccessful in restoring normal sinus rhythm, though though did convert him to a slower atrial flutter with ventricular rate 98-100 beats per minute, presenting to [facility] With syncope, atrial fibrillation with RVR, found to have  sick sinus syndrome with bradycardia on beta blockers, started on amiodarone b.i.d. With improvement of his rhythm, Though having episodes of bradycardia, discharge to rehabilitation at [facility] With continued symptoms, status post pacemaker  by Dr. Graciela Husbands.   started on diltiazem CD 120 mg daily and his amlodipine was decreased to 5 mg from 10 mg daily. He has had a 16 pound weight gain over the  and severe edema. He has redness of the legs, tightness and burning. Both Cardizem and amlodipine has since been held and his edema has resolved. He is currently taking Lasix once a day.  He reports that his weight has been stable. He has been monitoring his blood pressure closely and reports having profound hypotension in the morning. Initially it was felt this could be secondary to his terazosin which he takes before bed. This was held and subsequent blood pressure showed systolic pressure of 200. He was instructed to restart the medication though he had a syncopal or near syncopal episode while sitting in his seat and was unresponsive requiring evaluation in the emergency room. It was felt this episode was secondary to low blood pressure. The prostate medication was cut in half to 5 mg and since then he has been feeling well though continues to have labile pressures. He continues to have periodic systolic pressure in the  morning in the 80-90 range. This is infrequent though is still happening 78 as recently as yesterday. He denies any significant lower extremity edema, or shortness of breath.  Blood work from the hospital last week shows hematocrit 37, creatinine 1.8, BUN 37   EKG shows Paced rhythm with ventricular rate 72 beats per minute    Outpatient Encounter Prescriptions as of 10/26/2011  Medication Sig Dispense Refill  . acetaminophen (TYLENOL) 325 MG tablet Take 650 mg by mouth every 6 (six) hours as needed.        Marland Kitchen amiodarone (PACERONE) 200 MG tablet Take 0.5 tablets by mouth 2 (two) times daily.      Marland Kitchen aspirin (ASPIR-81) 81 MG EC tablet Take 81 mg by mouth every other day.        Marland Kitchen atorvastatin (LIPITOR) 20 MG tablet Take 10 mg by mouth daily.        . carbidopa-levodopa (SINEMET CR) 50-200 MG per tablet Take 1/2 tablet in the am 1/2 tablet 8 pm. Take one full tablet at 10 am, 2 pm & 5 pm.      . clopidogrel (PLAVIX) 75 MG tablet Take 75 mg by mouth daily.        . diclofenac (FLECTOR) 1.3 % PTCH Place 1 patch onto the skin 2 (two) times daily.       . furosemide (LASIX) 20 MG tablet Take 20 mg by mouth daily.       Marland Kitchen glucosamine-chondroitin 500-400 MG tablet Take 1 tablet by mouth daily.        Marland Kitchen  metoprolol (LOPRESSOR) 50 MG tablet Take 50 mg by mouth 2 (two) times daily.        . Multiple Vitamins-Minerals (MULTIVITAL) tablet Take 1 tablet by mouth daily.        . potassium chloride (K-DUR) 10 MEQ tablet Take 10 mEq by mouth daily.       . Saw Palmetto 450 MG CAPS Take by mouth 2 (two) times daily.       Marland Kitchen terazosin (HYTRIN) 5 MG capsule Take 1 capsule by mouth at bedtime.        Review of Systems  HENT: Negative.   Eyes: Negative.   Gastrointestinal: Negative.   Musculoskeletal: Positive for gait problem.  Skin: Negative.   Neurological: Positive for weakness.  Hematological: Negative.   Psychiatric/Behavioral: Negative.   All other systems reviewed and are negative.    BP 190/104   Pulse 70  Ht 6\' 1"  (1.854 m)  Wt 200 lb (90.719 kg)  BMI 26.39 kg/m2  Physical Exam  Nursing note and vitals reviewed. Constitutional: He is oriented to person, place, and time. He appears well-developed and well-nourished.  HENT:  Head: Normocephalic.  Nose: Nose normal.  Mouth/Throat: Oropharynx is clear and moist.  Eyes: Conjunctivae are normal. Pupils are equal, round, and reactive to light.  Neck: Normal range of motion. Neck supple. No JVD present.  Cardiovascular: Regular rhythm, S1 normal, S2 normal and intact distal pulses.  Tachycardia present.  Exam reveals no gallop and no friction rub.   Murmur heard.  Crescendo systolic murmur is present with a grade of 2/6  Pulmonary/Chest: Effort normal and breath sounds normal. No respiratory distress. He has no wheezes. He has no rales. He exhibits no tenderness.  Abdominal: Soft. Bowel sounds are normal. He exhibits no distension. There is no tenderness.  Musculoskeletal: Normal range of motion. He exhibits no edema and no tenderness.  Lymphadenopathy:    He has no cervical adenopathy.  Neurological: He is alert and oriented to person, place, and time. Coordination normal.  Skin: Skin is warm and dry. No rash noted. No erythema.  Psychiatric: He has a normal mood and affect. His behavior is normal. Judgment and thought content normal.           Assessment and Plan

## 2011-10-26 NOTE — Assessment & Plan Note (Signed)
Recent spell of loss of consciousness felt secondary to hypovolemia, restarting terazosin. We have suggested he take Lasix every other day given mild dehydration on recent blood work this past week. He does have underlying renal insufficiency. Creatinine was above his baseline.   If he continues to have low blood pressure in the morning with minimal change in his weight on Lasix every other day, we could try to hold the Lasix and take this when necessary for 3 pound weight gain.  If no improvement in these low systolic pressures in the morning, other options include decreasing the terazosin dose or changing to an alternate medication, possibly Flomax. Symptoms could be exacerbated by his Parkinson's medication which he chews and swallows at 7 AM. If symptoms persist, we have suggested he take the half pill without chewing.

## 2011-10-26 NOTE — Assessment & Plan Note (Signed)
We have suggested he stay on his Lipitor 

## 2011-10-26 NOTE — Assessment & Plan Note (Signed)
Labile blood pressures as mentioned. We will try to wean his Lasix. Given the low blood pressures in the morning, I'm reluctant to add additional medications for hypertension at this time.

## 2011-10-27 ENCOUNTER — Telehealth: Payer: Self-pay | Admitting: Cardiovascular Disease

## 2011-10-27 NOTE — Telephone Encounter (Signed)
Pt daughter would like to speak to nurse concerning pt BP. Would like to talk to nurse.

## 2011-10-27 NOTE — Telephone Encounter (Signed)
Called/ msg left and asked her to call back.

## 2011-10-28 NOTE — Telephone Encounter (Signed)
Called daughter, she stated that the terazosin was DC by Dr Randa Lynn and started flomax. This morning pt was feeling better, not so lethalis. Told to call with further questions or concerns.

## 2011-12-08 ENCOUNTER — Telehealth: Payer: Self-pay | Admitting: Cardiovascular Disease

## 2011-12-08 NOTE — Telephone Encounter (Signed)
Pharmacist from [facility] in [place] calling to speak with RN regarding the correct dosage that Johnny Wright should be taking.  They are taking over his care from [facility] and needs to verify medications.  Please call JJ back at [phone]

## 2011-12-08 NOTE — Telephone Encounter (Signed)
Spoke to Pharmacist JJ at [facility] wanting to clarify lasix directions,was told Dr.Gollan advised take lasix 20 mg every other day.May take a lasix if weight gain of 3 lbs.Wants to know if directions on potassium is same 10 meq daily.Fowarded to [employer] for advice.

## 2011-12-12 NOTE — Telephone Encounter (Signed)
Would probably only take the potassium when he takes lasix as you detailed (QOD) thx Tim

## 2011-12-13 NOTE — Telephone Encounter (Signed)
Spoke with JJ and gave her instructions from Dr. Mariah Milling

## 2011-12-13 NOTE — Telephone Encounter (Signed)
Pharmacy will be open after 9 AM. Will call again at that time.

## 2011-12-16 DIAGNOSIS — I5030 Unspecified diastolic (congestive) heart failure: Secondary | ICD-10-CM

## 2011-12-17 DIAGNOSIS — I5033 Acute on chronic diastolic (congestive) heart failure: Secondary | ICD-10-CM

## 2011-12-19 ENCOUNTER — Telehealth: Payer: Self-pay | Admitting: Cardiovascular Disease

## 2011-12-19 NOTE — Telephone Encounter (Signed)
Pt daughter calling has questions concerning his medications

## 2011-12-19 NOTE — Telephone Encounter (Signed)
Discussed medications with daughter.  Patient just out of the hospital and she was concerned about medication that caused him to have severe swelling in legs.  Advised daughter Diltiazem listed under allergies.  She was concerned about Amlodipine. Advised daughter per February office note with Dr Graciela Husbands Amlodipine discontinued secondary to low blood pressure

## 2012-03-21 ENCOUNTER — Telehealth: Payer: Self-pay | Admitting: Cardiovascular Disease

## 2012-03-21 NOTE — Telephone Encounter (Signed)
Pt daughter has concerns about medications pt is taking

## 2012-03-22 NOTE — Telephone Encounter (Signed)
Please see below and advise. thanks 

## 2012-03-22 NOTE — Telephone Encounter (Signed)
Would hold magnesium. He may be losing fluid that way causing dehydration and drop in blood pressure

## 2012-03-22 NOTE — Telephone Encounter (Signed)
LMTCB

## 2012-03-22 NOTE — Telephone Encounter (Signed)
Dtr called back. She has some concerns. She says pt was admitted to [facility] in April/May for CHF exacerbation and was d/c to rehab.  He is now living at home with [facility] aides coming into home on a regular basis.   Dtr was unaware of actual meds, since [facility]'s pill box for him. Dtr recently realized pt was started on magnesium oxide in hospital and has been taking since May.  He has been c/o episodes of diarrhea intermittently since then. She spoke with pharmacist who says this is a SE of Mag oxide. Dtr wonders if this can be stopped.  She is aware Dr. Mariah Milling did not prescribe (prescribed by hospitalist). After reviewing hosp records, i am unclear as to why he was started on this med at d/c.   She is also concerned about pt's BP dropping with position changes. He went to see Dr. Sandria Manly (neurologist) and he sent [place] a letter as well re: low BPs (120/80 sitting, 90/60 standing). Dr. Sandria Manly prescribed compression stockings for pt. He is wearing these.   Since there are 2 issues, magnesium oxide and orthostatic changes, I advsied dtr to have pt come in to address.  Understanding verb.  Appt made with Dr. Mariah Milling for 8/13 at 2:45 pm.  In the mean time, I will ask Dr. Mariah Milling if ok for pt to hold mag oxide until appt next week to see if this helps diarrhea.

## 2012-03-23 ENCOUNTER — Other Ambulatory Visit: Payer: Self-pay

## 2012-03-23 NOTE — Telephone Encounter (Signed)
pharmacare informed. Understanding verb.

## 2012-03-23 NOTE — Telephone Encounter (Signed)
Pt's dtr informed.  Understanding verb. She asks that I call [facility] to inform them of change since they fill his pill boxes I will call them

## 2012-03-23 NOTE — Telephone Encounter (Signed)
LMTCB

## 2012-03-27 ENCOUNTER — Encounter: Payer: Self-pay | Admitting: Cardiovascular Disease

## 2012-03-27 ENCOUNTER — Ambulatory Visit (INDEPENDENT_AMBULATORY_CARE_PROVIDER_SITE_OTHER): Payer: Medicare Other | Admitting: Cardiovascular Disease

## 2012-03-27 VITALS — BP 140/72 | HR 79 | Ht 73.0 in | Wt 200.0 lb

## 2012-03-27 DIAGNOSIS — I10 Essential (primary) hypertension: Secondary | ICD-10-CM

## 2012-03-27 DIAGNOSIS — R609 Edema, unspecified: Secondary | ICD-10-CM

## 2012-03-27 DIAGNOSIS — I4892 Unspecified atrial flutter: Secondary | ICD-10-CM

## 2012-03-27 NOTE — Assessment & Plan Note (Signed)
Blood pressure has improved by holding magnesium oxide, diarrhea has resolved. We have suggested a watch the blood pressure and his weight. Diuretic and blood pressure medication can be adjusted as needed.

## 2012-03-27 NOTE — Progress Notes (Signed)
Patient ID: Johnny Wright, male    DOB: [birth year], 78 y.o.   MRN: [id number]  HPI Comments: [name] is a very pleasant 78 year old gentleman with a history of numerous TIAs over the past 7 years,  history of stroke in 2008 per his report, hyperlipidemia, hypertension,  atrial flutter, Status post TEE and cardioversion at the end of June 2012 which was unsuccessful in restoring normal sinus rhythm, though though did convert him to a slower atrial flutter with ventricular rate 98-100 beats per minute, presenting to [facility] With syncope, atrial fibrillation with RVR, found to have  sick sinus syndrome with bradycardia on beta blockers, started on amiodarone b.i.d. With improvement of his rhythm, Though having episodes of bradycardia, discharge to rehabilitation at [facility] With continued symptoms, status post pacemaker  by Dr. Graciela Husbands.   started on diltiazem CD 120 mg daily and his amlodipine was decreased to 5 mg from 10 mg daily. He has had a 16 pound weight gain and severe edema. He has redness of the legs, tightness and burning. Both Cardizem and amlodipine has since been held and his edema has resolved. He is currently taking Lasix once a day with potassium.  He is currently living at twin [place] with nursing care at home. He was discharged previously from the hospital on magnesium oxide. This was causing significant diarrhea and orthostatic hypotension. Magnesium was recently held and his blood pressure has improved. He does have rare nausea. Blood pressure at home has been in the 140-150 range systolic, pulse in the 60-70 range. He wears compression hose recently when he had the low blood pressure. He denies any significant edema. Balance is relatively stable  EKG shows Paced rhythm with ventricular rate 79 beats per minute    Outpatient Encounter Prescriptions as of 03/27/2012  Medication Sig Dispense Refill  . acetaminophen (TYLENOL) 325 MG tablet Take 650 mg by mouth every 6 (six)  hours as needed.        Marland Kitchen amiodarone (PACERONE) 200 MG tablet Take 0.5 tablets by mouth 2 (two) times daily.      Marland Kitchen aspirin (ASPIR-81) 81 MG EC tablet Take 81 mg by mouth every other day.        Marland Kitchen atorvastatin (LIPITOR) 20 MG tablet Take 10 mg by mouth daily.        . carbidopa-levodopa (SINEMET CR) 50-200 MG per tablet Take 1/2 tablet in the am 1/2 tablet 8 pm. Take one full tablet at 10 am,  Take 1 1/2 tablet at 2 pm & 1 tablet at 5 pm.      . clopidogrel (PLAVIX) 75 MG tablet Take 75 mg by mouth daily.        . diclofenac (FLECTOR) 1.3 % PTCH Place 1 patch onto the skin 3 times/day as needed-between meals & bedtime.       Marland Kitchen glucosamine-chondroitin 500-400 MG tablet Take 1 tablet by mouth daily.        . metoprolol (LOPRESSOR) 50 MG tablet Take 25 mg by mouth 2 (two) times daily.       . Multiple Vitamins-Minerals (MULTIVITAL) tablet Take 1 tablet by mouth daily.        . potassium chloride (K-DUR) 10 MEQ tablet Take 10 mEq by mouth daily.       . Saw Palmetto 450 MG CAPS Take by mouth 2 (two) times daily.       Marland Kitchen amLODipine (NORVASC) 5 MG tablet Take 1 tablet (5 mg total) by mouth daily.  180 tablet  3  . furosemide (LASIX) 20 MG tablet Take 20 mg by mouth daily.         Review of Systems  HENT: Negative.   Eyes: Negative.   Respiratory: Negative.   Cardiovascular: Negative.   Gastrointestinal: Negative.   Musculoskeletal: Positive for gait problem.  Skin: Negative.   Hematological: Negative.   Psychiatric/Behavioral: Negative.   All other systems reviewed and are negative.    BP 140/72  Pulse 79  Ht 6\' 1"  (1.854 m)  Wt 200 lb (90.719 kg)  BMI 26.39 kg/m2  Physical Exam  Nursing note and vitals reviewed. Constitutional: He is oriented to person, place, and time. He appears well-developed and well-nourished.  HENT:  Head: Normocephalic.  Nose: Nose normal.  Mouth/Throat: Oropharynx is clear and moist.  Eyes: Conjunctivae are normal. Pupils are equal, round, and reactive  to light.  Neck: Normal range of motion. Neck supple. No JVD present.  Cardiovascular: Normal rate, regular rhythm, S1 normal, S2 normal and intact distal pulses.  Exam reveals no gallop and no friction rub.   Murmur heard.  Crescendo systolic murmur is present with a grade of 2/6  Pulmonary/Chest: Effort normal and breath sounds normal. No respiratory distress. He has no wheezes. He has no rales. He exhibits no tenderness.  Abdominal: Soft. Bowel sounds are normal. He exhibits no distension. There is no tenderness.  Musculoskeletal: Normal range of motion. He exhibits no edema and no tenderness.  Lymphadenopathy:    He has no cervical adenopathy.  Neurological: He is alert and oriented to person, place, and time. Coordination normal.  Skin: Skin is warm and dry. No rash noted. No erythema.  Psychiatric: He has a normal mood and affect. His behavior is normal. Judgment and thought content normal.           Assessment and Plan

## 2012-03-27 NOTE — Assessment & Plan Note (Signed)
Edema has significantly improved. We have suggested he stay on Lasix daily. If edema presents again with weight gain, I suggested he take an additional Lasix with potassium.

## 2012-03-27 NOTE — Assessment & Plan Note (Signed)
We have suggested that he stay on low-dose amiodarone and metoprolol twice a day. Currently maintaining normal sinus rhythm. We will closely watch his fluid status by measuring his weight daily.

## 2012-03-27 NOTE — Patient Instructions (Signed)
You are doing well. No medication changes were made.  Ok to hold the compression hose (thigh high) Consider wearing the knee high hose if you have them at home. Stay active.  Monitor your weight For weight gain more than 4 pounds, take extra lasix with extra potassium  Please call us if you have new issues that need to be addressed before your next appt.  Your physician wants you to follow-up in: 6 months.  You will receive a reminder letter in the mail two months in advance. If you don't receive a letter, please call our office to schedule the follow-up appointment.

## 2012-04-23 ENCOUNTER — Ambulatory Visit: Payer: Medicare Other | Admitting: Cardiovascular Disease

## 2012-06-06 ENCOUNTER — Encounter: Payer: Self-pay | Admitting: *Deleted

## 2012-06-12 ENCOUNTER — Ambulatory Visit (INDEPENDENT_AMBULATORY_CARE_PROVIDER_SITE_OTHER): Payer: Medicare Other | Admitting: Internal Medicine

## 2012-06-12 ENCOUNTER — Encounter: Payer: Self-pay | Admitting: Internal Medicine

## 2012-06-12 VITALS — BP 164/94 | HR 80 | Ht 73.0 in | Wt 197.0 lb

## 2012-06-12 DIAGNOSIS — I4892 Unspecified atrial flutter: Secondary | ICD-10-CM

## 2012-06-12 DIAGNOSIS — I959 Hypotension, unspecified: Secondary | ICD-10-CM

## 2012-06-12 DIAGNOSIS — Z950 Presence of cardiac pacemaker: Secondary | ICD-10-CM

## 2012-06-12 DIAGNOSIS — R001 Bradycardia, unspecified: Secondary | ICD-10-CM

## 2012-06-12 DIAGNOSIS — I10 Essential (primary) hypertension: Secondary | ICD-10-CM

## 2012-06-12 DIAGNOSIS — I498 Other specified cardiac arrhythmias: Secondary | ICD-10-CM

## 2012-06-12 LAB — PACEMAKER DEVICE OBSERVATION
AL IMPEDENCE PM: 466 Ohm
AL THRESHOLD: 1 V
ATRIAL PACING PM: 99
BATTERY VOLTAGE: 2.8 V

## 2012-06-12 MED ORDER — AMIODARONE HCL 200 MG PO TABS: 100.0000 mg | ORAL_TABLET | Freq: Every day | ORAL | Status: AC

## 2012-06-12 MED ORDER — ATENOLOL 50 MG PO TABS: 50.0000 mg | ORAL_TABLET | Freq: Every day | ORAL | Status: AC

## 2012-06-12 NOTE — Assessment & Plan Note (Signed)
If he remains much improved following if the bradycardia pacing. He is 100% atrially paced

## 2012-06-12 NOTE — Assessment & Plan Note (Signed)
No significant intercurrent atrial fibrillation. We will change his amiodarone 200 mg a day.

## 2012-06-12 NOTE — Progress Notes (Signed)
/ Patient Care Team: Johnny Cagey, MD as PCP - General (Unknown Physician Specialty)   HPI  Johnny Wright is a 78 y.o. male seen in followup for tachybbrady syndrome with rapid afib and sinus brady for which he underwent pacing in 05/2011  CHADSVAsc score of 5; He remains on ASA And plavix  He is much improved since pacing and is now back in independent living He and his daughter are very pleased with improvement   He continues to function quite well. His major problem however has been lightheadedness and low blood pressure that occurs after meals primarily breakfast. This meal consists of coffee and a meal or cold cereal. He typically takes he showers in the morning. He also has similar  Symptoms after lunch and supper about her lesser degree.  He takes antihypertensives and his Parkinson medicines in the morning     Past Medical History  Diagnosis Date  . Arthritic-like pain   . Cerebrovascular disease     Multiple TIAs/strokes  . Hyperlipidemia   . HTN (hypertension)   . Parkinson's disease     early stages  . Atrial fibrillation or flutter   . Sinus bradycardia   . Syncope   . Sinoatrial node dysfunction   . Pacemaker     Past Surgical History  Procedure Date  . Appendectomy 1945  . Cervical disc surgery 1970  . Cataract extraction 2005  . Pacemaker insertion     Current Outpatient Prescriptions  Medication Sig Dispense Refill  . acetaminophen (TYLENOL) 325 MG tablet Take 650 mg by mouth every 6 (six) hours as needed.        Marland Kitchen amiodarone (PACERONE) 200 MG tablet Take 0.5 tablets by mouth 2 (two) times daily.      Marland Kitchen amLODipine (NORVASC) 5 MG tablet Take 1 tablet (5 mg total) by mouth daily.  180 tablet  3  . aspirin (ASPIR-81) 81 MG EC tablet Take 81 mg by mouth every other day.        Marland Kitchen atorvastatin (LIPITOR) 20 MG tablet Take 10 mg by mouth daily.        . carbidopa-levodopa (SINEMET CR) 50-200 MG per tablet Take 1/2 tablet in the am 1/2 tablet 8 pm. Take  one full tablet at 10 am,  Take 1 1/2 tablet at 2 pm & 1 tablet at 5 pm.      . clopidogrel (PLAVIX) 75 MG tablet Take 75 mg by mouth daily.        . diclofenac (FLECTOR) 1.3 % PTCH Place 1 patch onto the skin 3 times/day as needed-between meals & bedtime.       . furosemide (LASIX) 20 MG tablet Take 20 mg by mouth daily.       Marland Kitchen glucosamine-chondroitin 500-400 MG tablet Take 1 tablet by mouth daily.        . metoprolol (LOPRESSOR) 50 MG tablet Take 25 mg by mouth 2 (two) times daily.       . Multiple Vitamins-Minerals (MULTIVITAL) tablet Take 1 tablet by mouth daily.        . potassium chloride (K-DUR) 10 MEQ tablet Take 10 mEq by mouth daily.       . Saw Palmetto 450 MG CAPS Take by mouth 2 (two) times daily.       Marland Kitchen DISCONTD: hydrALAZINE (APRESOLINE) 50 MG tablet Take 50 mg by mouth 4 (four) times daily. Hold for systolic pressure less than 150.        Allergies  Allergen Reactions  . Diltiazem Hcl (Diltiazem Hcl)      Severe lower extremity edema  . Magnesium-Containing Compounds     Review of Systems negative except from HPI and PMH  Physical Exam BP 164/94  Pulse 80  Ht 6\' 1"  (1.854 m)  Wt 197 lb (89.359 kg)  BMI 25.99 kg/m2 Well developed and well nourished in no acute distress HENT normal E scleral and icterus clear Neck Supple JVP flat; carotids brisk and full Clear to ausculation  Regular rate and rhythm,   Soft with active bowel sounds No clubbing cyanosis 2+ Edema Alert and oriented, grossly normal motor and sensory function Skin Warm and Dry    Assessment and  Plan

## 2012-06-12 NOTE — Assessment & Plan Note (Signed)
The patient's device was interrogated and the information was fully reviewed.  The device was reprogrammed to shortening AV delay and hopefully improve cardiac performance. This may be particularly beneficial in someone who has a stiff heart from hypertensive heart disease

## 2012-06-12 NOTE — Assessment & Plan Note (Signed)
He has significant supine hypertension. This will make it difficult to use something like ProAmatine on an as-needed basis.

## 2012-06-12 NOTE — Assessment & Plan Note (Signed)
The patient gives a classic history of postprandial hypotension. This is seen primarily in the elderly and is associated with splancnic shunting related to meals. Hot meals fatty meals and larger meals or more likely associated. His early morning male seems to be the bigger problem. He is taking his Lopressor at that time as well as having taken a shower just before.  I have suggested that we   #1 change his beta blocker to atenolol and take in the evening #2 drink bottled water when he gets up in the morning #3 take a shower the evening #4 anticipate that he will be lightheaded for 30-60 minutes after meals and as he particularly likes his coffee just plan to watch TV or reading book at that time #5 we'll change his amiodarone to once a day and evening

## 2012-06-12 NOTE — Patient Instructions (Addendum)
Your physician has recommended you make the following change in your medication: START Atenolol 50mg  every evening.  STOP your Metoprolol.  DECREASE your Amiodarone to one-half tab (100mg ) once daily.  Your physician wants you to follow-up in: 6 months.  You will receive a reminder letter in the mail two months in advance. If you don't receive a letter, please call our office to schedule the follow-up appointment.

## 2012-09-11 IMAGING — CR DG CHEST 1V PORT
1 series · 1 of 1 positions shown · non-contrast
Comparison: None.

CLINICAL DATA: History of bradycardia.  Evaluation prior to
pacemaker insertion.

PORTABLE CHEST - 1 VIEW

[view not recorded]
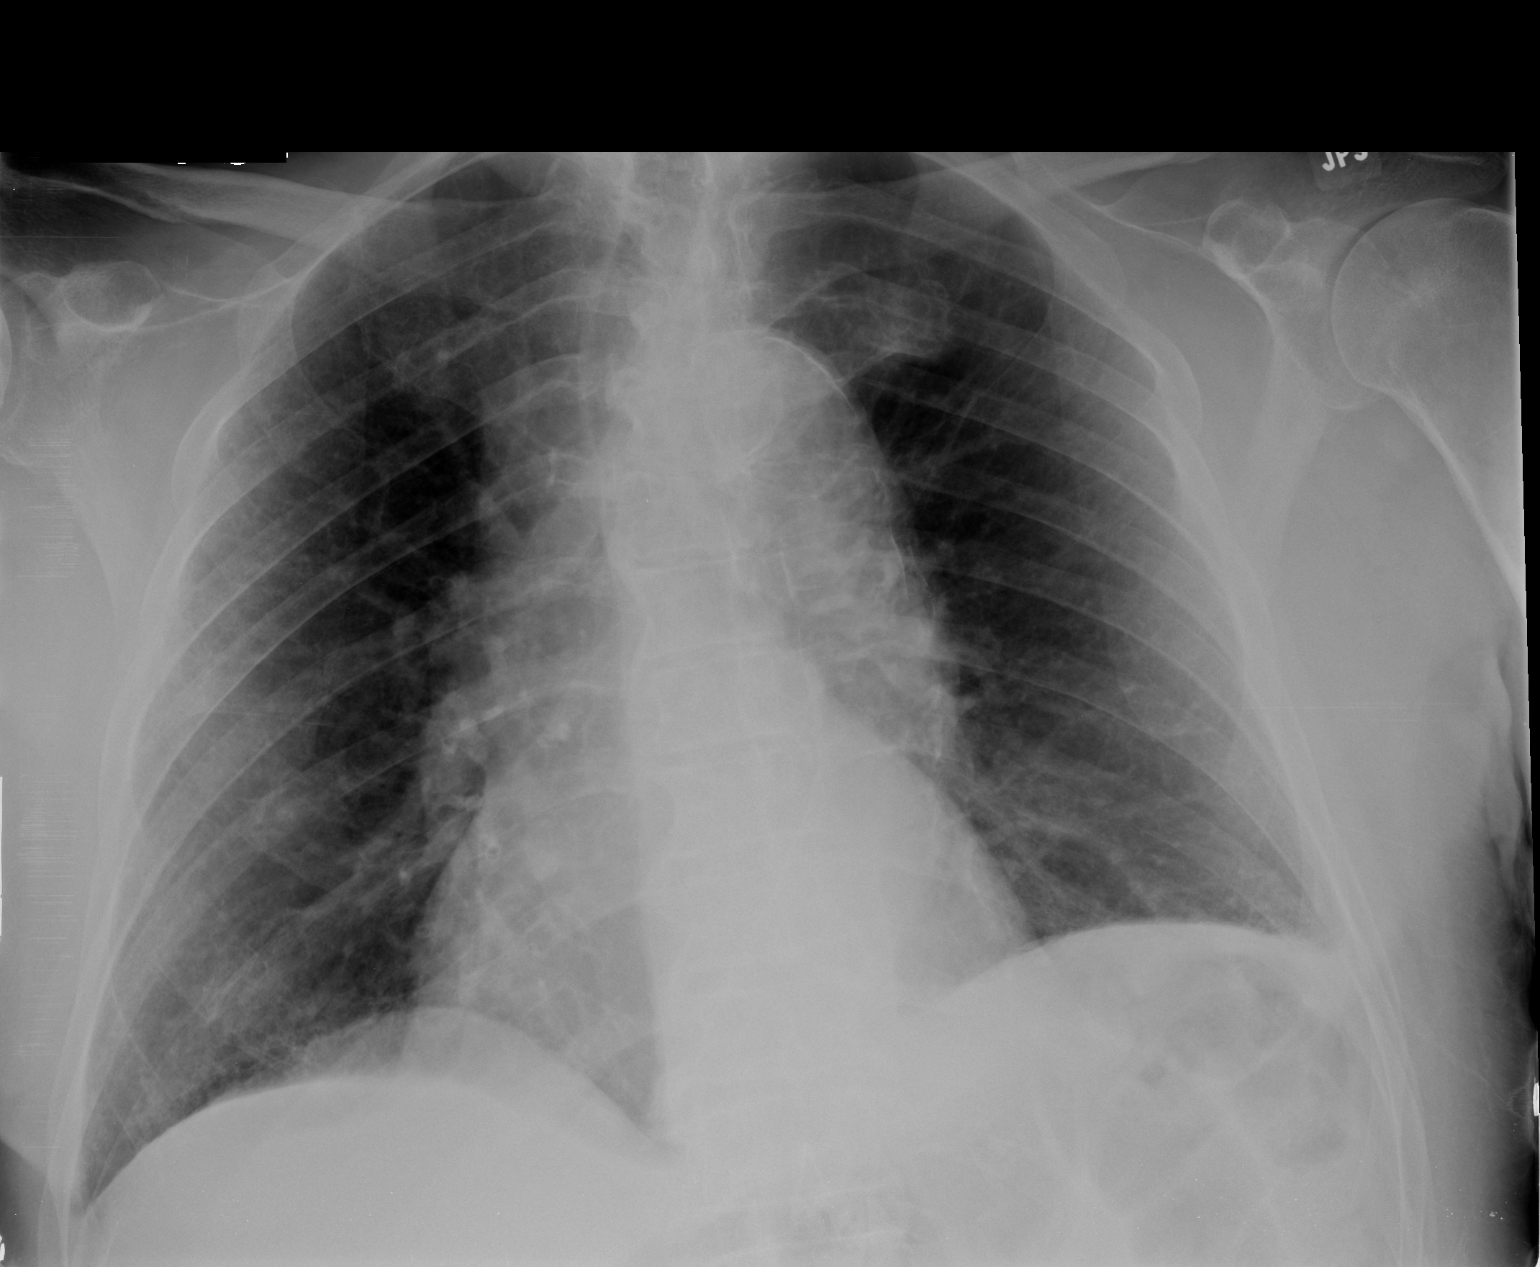

[1 of 1 positions shown; findings below may reference images not displayed]

FINDINGS: Cardiac silhouette is upper range of normal size. Ectasia
and nonaneurysmal calcification of the thoracic aorta are seen.  No
pulmonary edema, pneumonia, or pleural effusion is seen.
Osteophytes are present in the spine.
IMPRESSION: Cardiac silhouette upper range of normal size.  No pulmonary edema
or pleural effusion is evident.

## 2012-09-12 IMAGING — CR DG CHEST 2V
1 series · 1 of 1 positions shown · non-contrast
Comparison: 06/13/2011

CLINICAL DATA: Post pacemaker placement.

CHEST - 2 VIEW

[view not recorded]
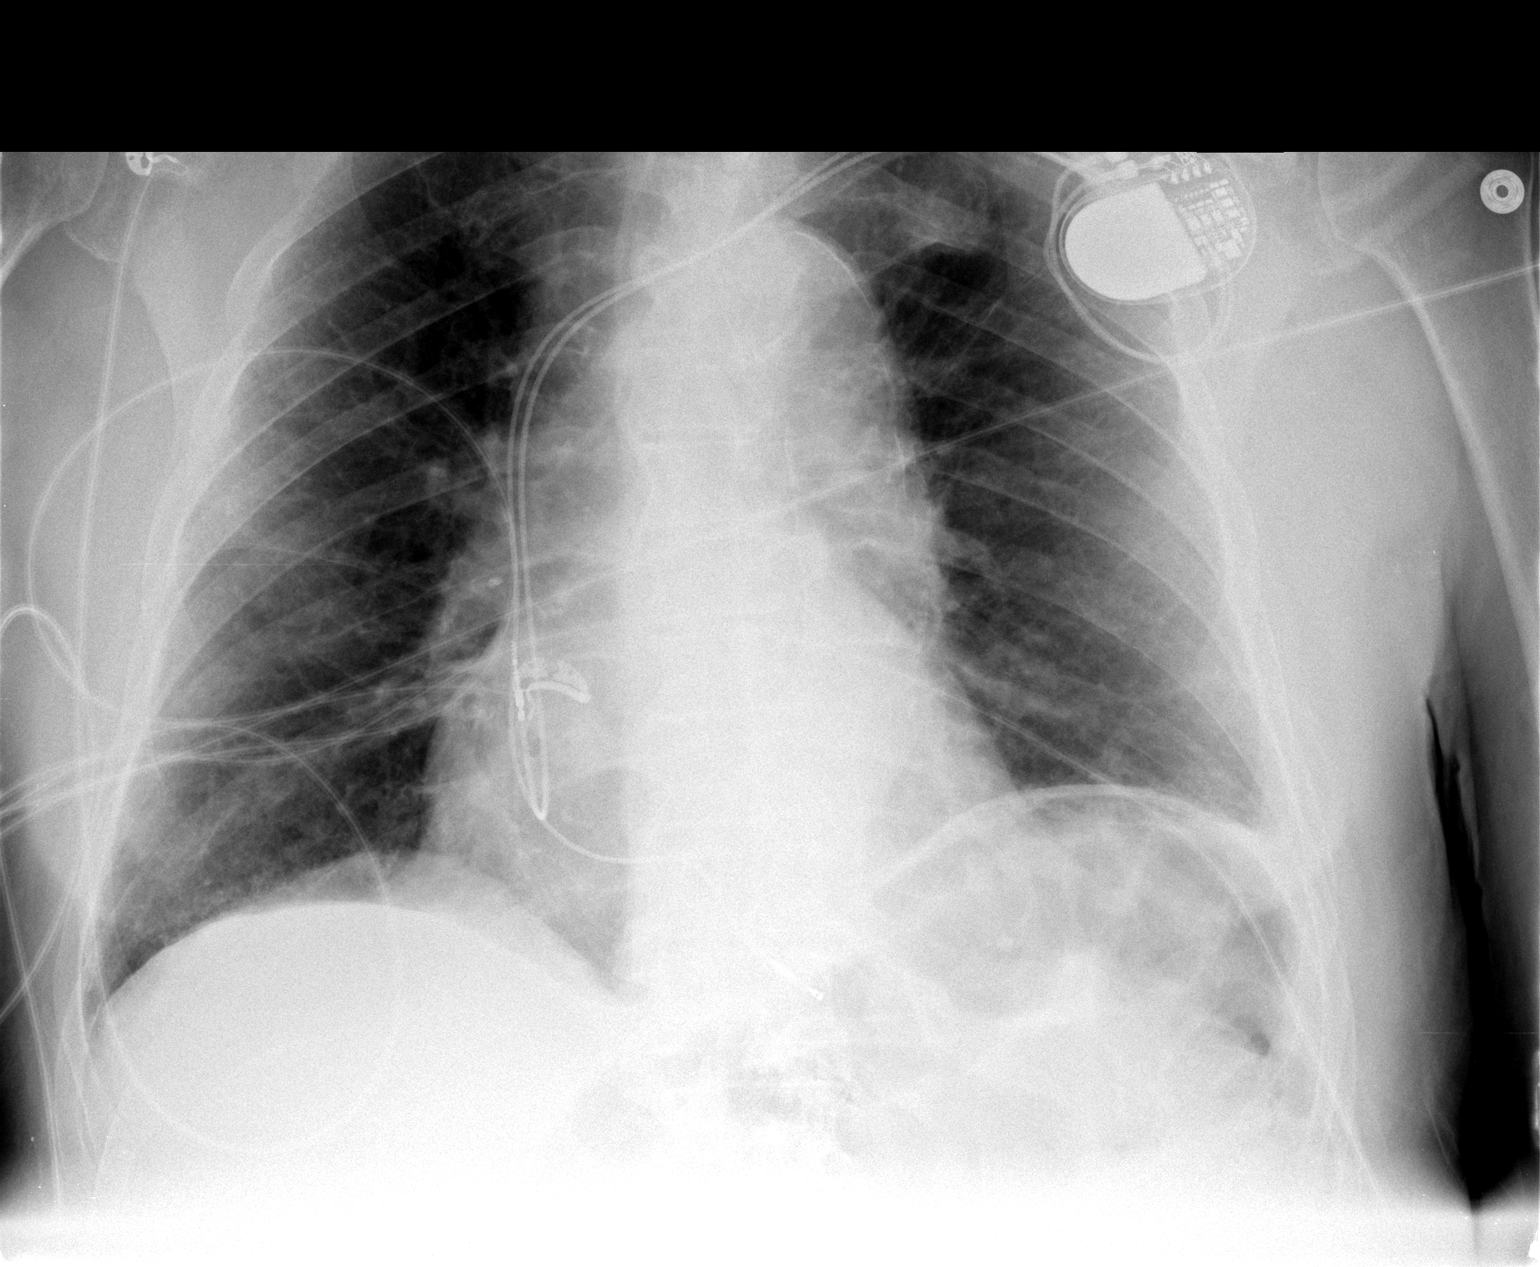

[1 of 1 positions shown; findings below may reference images not displayed]

FINDINGS: Two views of the chest demonstrate a left cardiac
pacemaker.  The cardiac leads are in the region of the right
ventricle and right atrium.  No evidence for a pneumothorax.
Slightly prominent lung markings appear chronic.  Thoracic aorta is
heavily calcified.  Heart size is within normal limits.
IMPRESSION: Placement of left cardiac pacemaker.  No evidence for a
pneumothorax.

## 2012-09-17 ENCOUNTER — Telehealth: Payer: Self-pay | Admitting: Internal Medicine

## 2012-09-17 ENCOUNTER — Encounter: Payer: Medicare Other | Admitting: *Deleted

## 2012-09-17 NOTE — Telephone Encounter (Signed)
Pt's dtr calling re transmission, did not know he had a remote today, he does not have a transmitter, can it be ordered now?

## 2012-09-18 NOTE — Telephone Encounter (Signed)
Left message for Leann that transmitter will be ordered and she can send upon arrival.

## 2012-09-21 ENCOUNTER — Encounter: Payer: Self-pay | Admitting: *Deleted

## 2012-10-04 ENCOUNTER — Other Ambulatory Visit: Payer: Self-pay

## 2012-10-04 ENCOUNTER — Telehealth: Payer: Self-pay | Admitting: *Deleted

## 2012-10-04 ENCOUNTER — Ambulatory Visit (INDEPENDENT_AMBULATORY_CARE_PROVIDER_SITE_OTHER): Payer: Medicare Other | Admitting: *Deleted

## 2012-10-04 DIAGNOSIS — Z950 Presence of cardiac pacemaker: Secondary | ICD-10-CM

## 2012-10-04 DIAGNOSIS — R001 Bradycardia, unspecified: Secondary | ICD-10-CM

## 2012-10-04 DIAGNOSIS — I498 Other specified cardiac arrhythmias: Secondary | ICD-10-CM

## 2012-10-04 NOTE — Telephone Encounter (Signed)
Pt received transmitter, not sure if he needs an appt to know how to set it up. Geoffery Spruce called)

## 2012-10-04 NOTE — Telephone Encounter (Signed)
Spoke w/Leeann and explained how to send transmission. All questions were answered.

## 2012-10-05 ENCOUNTER — Telehealth: Payer: Self-pay | Admitting: Internal Medicine

## 2012-10-05 NOTE — Telephone Encounter (Signed)
Pt's dtr calling re transmission results

## 2012-10-05 NOTE — Telephone Encounter (Signed)
Transmission was received and all was within normal limits.

## 2012-10-07 LAB — REMOTE PACEMAKER DEVICE
AL IMPEDENCE PM: 500 Ohm
AL THRESHOLD: 1 V
BAMS-0001: 150 {beats}/min
BATTERY VOLTAGE: 2.79 V
RV LEAD THRESHOLD: 0.625 V

## 2012-10-15 ENCOUNTER — Telehealth: Payer: Self-pay

## 2012-10-15 NOTE — Telephone Encounter (Signed)
Pts dtr called to say pt is accumulating fluid; says he is having worsening sob and edema Weight is up 5 pounds from yesterday  BP=160/97 O2=95% on RA He is taking lasix 20 mg daily I advised, per Dr. Windell Hummingbird last note, to have pt take lasix 20 mg BID x 3 days and we will call Wednesday to reassess I also advised her to monitor pt's BP closely since it may drop once we increase lasix Understanding verb

## 2012-10-17 ENCOUNTER — Telehealth: Payer: Self-pay

## 2012-10-17 NOTE — Telephone Encounter (Signed)
lmtcb

## 2012-10-17 NOTE — Telephone Encounter (Signed)
I spoke with pt's dtr, who says pt's weight is down 3 pounds to 194 pounds.  Edema has improved and sob has improved still c/o weakness Pt continues taking lasix and KCL for edema and weight gain I advised he should only take this 2 more days (if at all) since weight has improved and symptoms have improved) They made appt for Dr. Graciela Husbands for 6 month f/u I offered to make appt with dr. Mariah Milling since he is overdue dtr wishes to wait until after appt with Dr. Graciela Husbands in May They will call us sooner should she have further concerns

## 2012-10-17 NOTE — Telephone Encounter (Signed)
Message copied by [facility], MELISSA E on Wed Oct 17, 2012  8:12 AM ------      Message from: MCNEAL, MELISSA E      Created: Mon Oct 15, 2012 11:58 AM       Assess edema and weight ------

## 2012-10-17 NOTE — Telephone Encounter (Signed)
Assess edema and weights

## 2012-10-23 ENCOUNTER — Encounter: Payer: Self-pay | Admitting: *Deleted

## 2012-10-24 ENCOUNTER — Telehealth: Payer: Self-pay

## 2012-10-24 ENCOUNTER — Other Ambulatory Visit: Payer: Self-pay

## 2012-10-24 ENCOUNTER — Ambulatory Visit (INDEPENDENT_AMBULATORY_CARE_PROVIDER_SITE_OTHER): Payer: Medicare Other

## 2012-10-24 DIAGNOSIS — R609 Edema, unspecified: Secondary | ICD-10-CM

## 2012-10-24 DIAGNOSIS — R0602 Shortness of breath: Secondary | ICD-10-CM

## 2012-10-24 NOTE — Telephone Encounter (Signed)
I discussed with Dr. Mariah Milling who suggests pt come in for lab work before we can make any decision re: meds I will call dtr to inform

## 2012-10-24 NOTE — Telephone Encounter (Signed)
Pt's dtr, Leanne, called to say pt is retaining more fluid despite lasix 20 mg BID Says weight is up 5 pounds from usual home weight (193-198 pounds) But says U/O has significantly decreased and urine is dark Pt c/o worsening sob BP=145/81 Wearing TED hose Has 24 hour care giving services I told dtr I would talk with Dr. Mariah Milling and call her back with instructions Understanding verb

## 2012-10-24 NOTE — Telephone Encounter (Signed)
I spoke with dtr who asks if labs can be drawn at [facility] I will call to see and call her back

## 2012-10-24 NOTE — Telephone Encounter (Signed)
I spoke with Johnny Wright' nurse dept They tell me they are able to draw labs on patients but pts have to come to clinic on site at 0730 on Monday's and thursdays  dtr does not think he can get there that early She asks if ok for pt to go to [name] office since it is across the street I advised ok and I will fax order to them

## 2012-10-25 ENCOUNTER — Telehealth: Payer: Self-pay | Admitting: *Deleted

## 2012-10-25 ENCOUNTER — Encounter: Payer: Self-pay | Admitting: Internal Medicine

## 2012-10-25 DIAGNOSIS — R609 Edema, unspecified: Secondary | ICD-10-CM

## 2012-10-25 LAB — BASIC METABOLIC PANEL
BUN/Creatinine Ratio: 18 (ref 10–22)
CO2: 24 mmol/L (ref 19–28)
Chloride: 89 mmol/L — ABNORMAL LOW (ref 97–108)
GFR calc Af Amer: 45 mL/min/{1.73_m2} — ABNORMAL LOW (ref >59)
Glucose: 81 mg/dL (ref 65–99)
Potassium: 4.2 mmol/L (ref 3.5–5.2)

## 2012-10-25 NOTE — Telephone Encounter (Signed)
Pt daughter calling for labs results

## 2012-10-25 NOTE — Telephone Encounter (Signed)
Lab results reviewed with Dr. Mariah Milling. Sodium- 128, K+ 4.2, BUN- 28, creat- 1.56. Per Dr. Mariah Milling, he is concerned that the patient is overly dry at this point despite his symptoms. He recommends decreasing lasix to 20 mg once daily. He is ok with weights in the upper 190's. I have spoken with the patient's daughter. She is aware of results and recommended med changes. I have advised her to monitor the patient over the weekend and call Monday with how he is doing. She states his weight was down 2 lbs today and he felt a little better. He will push his water a little more over the weekend. Per Dr. Mariah Milling, f/u in the office first available. I have offered 11/13/12. This will not work per the patient's daughter. I explained that when she calls back on Monday, we can discuss scheduling f/u for him based on his symptoms. She is agreeable with this plan. She has also been advised the patient may take his second lasix if his weight goes over 200 lbs.

## 2012-10-29 ENCOUNTER — Telehealth: Payer: Self-pay | Admitting: *Deleted

## 2012-10-29 DIAGNOSIS — I509 Heart failure, unspecified: Secondary | ICD-10-CM

## 2012-10-29 NOTE — Telephone Encounter (Signed)
pts dtr, leanne called to say pt is "getting worse" Says nurse at [facility] is going to assess pt now They decreased lasix to 1 tablet daily as directed by Dr. Mariah Milling 3/13 He has also increased his fluid intake as instructed Beginning Friday, pt has had very little to no urine output, urine is dark amber color Also c/o left flank and shoulder pain dtr tried calling us Saturday and our phones never rolled to on call service She called PCP who advised pt go back to lasix BID No change in fluid/urine out put status VSS C/o abdominal distension, no LE edema, wearing TED hose Very weak and "difficulty talking" d/t sob Weight is now at 200 pounds  Because of decreased/no u/o, weight up, increase in lasix not helping and last lab value of Na=128, as well as c/o left flank pain and weakness, I advised dtr to have pt go to ER She verb understanding and agrees with plan She will contact Angelica Chessman, [facility] nurse and advise, but she feels Angelica Chessman will feel same way

## 2012-10-29 NOTE — Telephone Encounter (Signed)
Pt daughter calling wanting to speak with nurse concerning pt.pt was unable to use the restroom even when they went back to 2 lasix. Pt weak. Vitals were good and was having difficulty talking. Weight is up to 200. Thinks that he may need to go to the ED

## 2012-10-30 ENCOUNTER — Telehealth: Payer: Self-pay

## 2012-10-30 DIAGNOSIS — I359 Nonrheumatic aortic valve disorder, unspecified: Secondary | ICD-10-CM

## 2012-10-30 NOTE — Telephone Encounter (Signed)
See below

## 2012-10-30 NOTE — Telephone Encounter (Signed)
Pt daughter called and states she missed Dr Mariah Milling when he came around to see pt in hospital this a.m. She would like to talk with him.

## 2012-10-31 DIAGNOSIS — I509 Heart failure, unspecified: Secondary | ICD-10-CM

## 2012-11-02 ENCOUNTER — Telehealth: Payer: Self-pay

## 2012-11-02 NOTE — Telephone Encounter (Signed)
TCM  

## 2012-11-02 NOTE — Telephone Encounter (Signed)
D/c 3/21 TCM attempt 3/24

## 2012-11-02 NOTE — Telephone Encounter (Signed)
Message copied by Marcelle Overlie on Fri Nov 02, 2012 11:21 AM ------      Message from: Coralee Rud      Created: Fri Nov 02, 2012 11:17 AM      Regarding: tcm/eph       Pt scheduled 4/7 ------

## 2012-11-05 NOTE — Telephone Encounter (Signed)
TCM call I was able to speak with pt's dtr, Johnny Wright She says pt was d/c from [facility] Friday 3/21 [facility] Was d/c home on lisinopril Concerned b/c this had been d/c'd in past d/t hypotension, says she questioned this in hospital and was told by nurse it would be "flagged" I told her I would discuss with Dr. Mariah Milling, will ask if he needs to be on lisinopril, and will relay info to nurse at [facility].  Dtr  Verb understanding She confirms knowledge of appt 4/7 but asks for this to be moved "out" so she can come with pt R/s to 4/17 with Dr. Mariah Milling

## 2012-11-05 NOTE — Telephone Encounter (Signed)
Please see below and advise. thanks 

## 2012-11-05 NOTE — Telephone Encounter (Signed)
TCM attempt #1 LMTCB

## 2012-11-06 NOTE — Telephone Encounter (Signed)
I seem to recall his blood pressure was higher in the hospital. Can be obtained [facility] records? We can look for lisinopril

## 2012-11-07 NOTE — Telephone Encounter (Signed)
i obtained [facility] records Sent to be scanned Confirmed he was d/c home on this dtr's concern is hypotension at rehab as this has happened in past Do we keep him on this?

## 2012-11-07 NOTE — Telephone Encounter (Signed)
Is there a way we can obtain his recent blood pressures before we stopped any medication? How is he doing clinically?

## 2012-11-07 NOTE — Telephone Encounter (Signed)
lmtcb on Johnny Wright's vm re:# for location of his rehab facility Cannot find pt in any of these local facilities

## 2012-11-13 NOTE — Telephone Encounter (Signed)
dtr called to say pt is residing in [facility] facility I will call them to check on pt's status

## 2012-11-13 NOTE — Telephone Encounter (Signed)
FYI

## 2012-11-13 NOTE — Telephone Encounter (Signed)
I was able to speak with pt's nurse who says pt is actually at PT right now Pt's O2 was d/c'd by an MD this am at an appt They will be monitoring him as he goes  BPs have varied: 180/90, 110/60, 110/70, 126/80, 134/60, 162/90, 110/70 Says weights have been stable and have only varied by </= 1 pound Continues taking lisinopril, atenolol and torsemide as prescribed

## 2012-11-14 ENCOUNTER — Telehealth: Payer: Self-pay

## 2012-11-14 NOTE — Telephone Encounter (Signed)
Pts dtr called to let us know Dr. Randa Lynn d/c'd lisinopril yesterday I will make Dr. Mariah Milling aware

## 2012-11-19 ENCOUNTER — Encounter: Payer: Medicare Other | Admitting: Cardiovascular Disease

## 2012-11-23 ENCOUNTER — Encounter: Payer: Medicare Other | Admitting: Cardiovascular Disease

## 2012-11-29 ENCOUNTER — Encounter: Payer: Medicare Other | Admitting: Cardiovascular Disease

## 2012-11-29 ENCOUNTER — Ambulatory Visit (INDEPENDENT_AMBULATORY_CARE_PROVIDER_SITE_OTHER): Payer: Medicare Other | Admitting: Cardiovascular Disease

## 2012-11-29 ENCOUNTER — Encounter: Payer: Self-pay | Admitting: Cardiovascular Disease

## 2012-11-29 VITALS — BP 128/78 | HR 74 | Ht 73.0 in | Wt 177.8 lb

## 2012-11-29 DIAGNOSIS — I5041 Acute combined systolic (congestive) and diastolic (congestive) heart failure: Secondary | ICD-10-CM

## 2012-11-29 DIAGNOSIS — I4892 Unspecified atrial flutter: Secondary | ICD-10-CM

## 2012-11-29 DIAGNOSIS — I10 Essential (primary) hypertension: Secondary | ICD-10-CM

## 2012-11-29 DIAGNOSIS — R609 Edema, unspecified: Secondary | ICD-10-CM

## 2012-11-29 DIAGNOSIS — R0602 Shortness of breath: Secondary | ICD-10-CM

## 2012-11-29 NOTE — Assessment & Plan Note (Signed)
Continue current medications. Currently paced rhythm

## 2012-11-29 NOTE — Progress Notes (Signed)
Patient ID: Johnny Wright, male    DOB: 1921/04/09, 79 y.o.   MRN: [id number]  HPI Comments: [name] is a very pleasant 79 year old gentleman with a history of numerous TIAs over the past 7 years,  history of stroke in 2008 per his report, hyperlipidemia, hypertension,  atrial flutter, Status post TEE and cardioversion at the end of June 2012 which was unsuccessful in restoring normal sinus rhythm, though though did convert him to a slower atrial flutter with ventricular rate 98-100 beats per minute, presenting to [facility] With syncope, atrial fibrillation with RVR, found to have  sick sinus syndrome with bradycardia on beta blockers, started on amiodarone b.i.d. With improvement of his rhythm, Though having episodes of bradycardia, discharge to rehabilitation at [facility] With continued symptoms, status post pacemaker  by Dr. Graciela Husbands.   started on diltiazem CD 120 mg daily. amlodipine was decreased to 5 mg  daily.  16 pound weight gain and severe edema. He has redness of the legs, tightness and burning. Both Cardizem and amlodipine has since been held and his edema resolved. He was taking Lasix once a day with potassium.  He presented to the hospital March 17 with acute on chronic diastolic CHF, diuretic changed to torsemide with good diuresis. At the time of discharge, he was sent to [facility] [place]. He has been there for the past month and has had significant weight loss. Family believes is secondary to depression and anorexia. Currently he is living at home with a caretaker.  Echocardiogram in the hospital showed ejection fraction 40-45%, right ventricular systolic pressure estimated at 50 mmHg, mild aortic valve stenosis  EKG shows Paced rhythm with ventricular rate 74 beats per minute    Outpatient Encounter Prescriptions as of 11/29/2012  Medication Sig Dispense Refill  . acetaminophen (TYLENOL) 325 MG tablet Take 650 mg by mouth every 6 (six) hours as needed.        Marland Kitchen amiodarone  (PACERONE) 200 MG tablet Take 0.5 tablets (100 mg total) by mouth daily.  45 tablet  1  . aspirin (ASPIR-81) 81 MG EC tablet Take 81 mg by mouth every other day.        Marland Kitchen atenolol (TENORMIN) 50 MG tablet Take 1 tablet (50 mg total) by mouth at bedtime.  90 tablet  1  . atorvastatin (LIPITOR) 20 MG tablet Take 5 mg by mouth daily.       . carbidopa-levodopa (SINEMET CR) 50-200 MG per tablet Take 1 tablet in the am 1/2 tablet 8 pm. Take one full tablet at 10 am,  Take 1 1/2 tablet at 2 pm & 1 tablet at 5 pm.      . clopidogrel (PLAVIX) 75 MG tablet Take 75 mg by mouth daily.        . diclofenac (FLECTOR) 1.3 % PTCH Place 1 patch onto the skin as needed.       Marland Kitchen glucosamine-chondroitin 500-400 MG tablet Take 1 tablet by mouth daily.        . Multiple Vitamins-Minerals (MULTIVITAL) tablet Take 1 tablet by mouth daily.        . potassium chloride (K-DUR) 10 MEQ tablet Take 10 mEq by mouth daily. Take only when you take an extra Torsemide.      . Saw Palmetto 450 MG CAPS Take by mouth as needed.       . Tamsulosin HCl (FLOMAX) 0.4 MG CAPS Take 0.4 mg by mouth daily.      Marland Kitchen torsemide (DEMADEX) 20  MG tablet Take 20 mg by mouth daily. Take an extra tablet once daily if weight gain of 2 pounds in 24 hours.      . triamcinolone cream (KENALOG) 0.1 % Apply topically 2 (two) times daily.      . [DISCONTINUED] furosemide (LASIX) 20 MG tablet Take 1 tablet (20 mg total) by mouth daily.       No facility-administered encounter medications on file as of 11/29/2012.    Review of Systems  HENT: Negative.   Eyes: Negative.   Respiratory: Negative.   Cardiovascular: Negative.   Gastrointestinal: Negative.   Musculoskeletal: Positive for gait problem.  Skin: Negative.   Psychiatric/Behavioral: Negative.   All other systems reviewed and are negative.    BP 128/78  Pulse 74  Ht 6\' 1"  (1.854 m)  Wt 177 lb 12 oz (80.627 kg)  BMI 23.46 kg/m2  Physical Exam  Nursing note and vitals  reviewed. Constitutional: He is oriented to person, place, and time. He appears well-developed and well-nourished.  Presents in a wheelchair today  HENT:  Head: Normocephalic.  Nose: Nose normal.  Mouth/Throat: Oropharynx is clear and moist.  Eyes: Conjunctivae are normal. Pupils are equal, round, and reactive to light.  Neck: Normal range of motion. Neck supple. No JVD present.  Cardiovascular: Normal rate, regular rhythm, S1 normal, S2 normal and intact distal pulses.  Exam reveals no gallop and no friction rub.   Murmur heard.  Crescendo systolic murmur is present with a grade of 2/6  Pulmonary/Chest: Effort normal and breath sounds normal. No respiratory distress. He has no wheezes. He has no rales. He exhibits no tenderness.  Abdominal: Soft. Bowel sounds are normal. He exhibits no distension. There is no tenderness.  Musculoskeletal: Normal range of motion. He exhibits no edema and no tenderness.  Lymphadenopathy:    He has no cervical adenopathy.  Neurological: He is alert and oriented to person, place, and time. Coordination normal.  Skin: Skin is warm and dry. No rash noted. No erythema.  Psychiatric: He has a normal mood and affect. His behavior is normal. Judgment and thought content normal.      Assessment and Plan

## 2012-11-29 NOTE — Assessment & Plan Note (Signed)
BMP pending. Continue torsemide 20 mg daily for now until results are available

## 2012-11-29 NOTE — Assessment & Plan Note (Signed)
Weight has dropped transiently over the past month from poor by mouth intake. We'll check a basic metabolic panel today and BNP. Overall appears euvolemic. We'll evaluate to make sure he is not dehydrated. If creatinine is high, would change torsemide to every other day

## 2012-11-29 NOTE — Patient Instructions (Addendum)
You are doing well. No medication changes were made.  We will check a BMP and BNP today  Please call us if you have new issues that need to be addressed before your next appt.  Your physician wants you to follow-up in: 3 months.  You will receive a reminder letter in the mail two months in advance. If you don't receive a letter, please call our office to schedule the follow-up appointment.  Do not give fluid pill if weight less than 173 pounds.

## 2012-11-29 NOTE — Assessment & Plan Note (Signed)
Blood pressure is well controlled on today's visit. No changes made to the medications. 

## 2012-11-30 ENCOUNTER — Telehealth: Payer: Self-pay

## 2012-11-30 LAB — BASIC METABOLIC PANEL
BUN/Creatinine Ratio: 18 (ref 10–22)
Creatinine, Ser: 1.45 mg/dL — ABNORMAL HIGH (ref 0.76–1.27)
GFR calc non Af Amer: 42 mL/min/{1.73_m2} — ABNORMAL LOW (ref >59)
Sodium: 138 mmol/L (ref 134–144)

## 2012-11-30 NOTE — Telephone Encounter (Signed)
I was able to explain this to dtr She verb understanding and will keep a log of BPs and if associated with meals, etc I will call her back 12/03/12

## 2012-11-30 NOTE — Telephone Encounter (Signed)
I attempted to reach Dr. Jasper Loser office again. They are closed

## 2012-11-30 NOTE — Telephone Encounter (Signed)
pts dtr called to make [place] aware that pt's BP last night between 7 and 8 pm was 71/46, 77/48, an deventually came up to 108/66 Pt was very weak when pressures were this low Doing better this am She is unsure if we need to make any med changes, etc I advised we will get lab results from yesterday and I will discuss with Dr. Mariah Milling and call her back Understanding verb  She also asks who she should speak with at pt's former rehab facility [id number] amt of weight loss pt had aty facility and the fact that they did not contact anyone about this. I advised her to start with nursing director at facility She will try this  She also asks if we have gotten Dr. Jasper Loser last note from end of march so we can see his weight. I explained we requested this tyesterday afternoon but have not received I will try calling again  We will call her back with lab results and Dr. Windell Hummingbird opinion re:BP at [phone] Understanding verb

## 2012-11-30 NOTE — Telephone Encounter (Signed)
I discussed with Dr. Mariah Milling who says BMP is unchanged from previous one He advised to have pt hold diuretic if weight less than 177 pounds, thinks he may be dehydrated thus causing the drop in BP He also advises to have family/caregiver's keep a log of BP trends and if BP is dropping after eating a meal, he may need Midodrine We should wait to hear trends in BP

## 2012-12-03 ENCOUNTER — Telehealth: Payer: Self-pay

## 2012-12-03 NOTE — Telephone Encounter (Signed)
Requested most recent office note from Dr. Jasper Loser office

## 2012-12-03 NOTE — Telephone Encounter (Signed)
I spoke with Leanne, pt's dtr. She says she spoke with pt this am and he reported he had a "better weekend". He gained 1 pound over w/e from food, has had no further episodes of low BP. dtr and caregivers will continue to monitor his symptoms and will let us know otherwise.  Dtr also asks if we have pt's weight at d/c from [facility] as well as Dr. Jasper Loser last note that documents his weight at their visit  I told her I would try to get this info and call her back understanding verb

## 2012-12-03 NOTE — Telephone Encounter (Signed)
Assess BPs and weights

## 2012-12-03 NOTE — Telephone Encounter (Signed)
Per [facility] weight at d/c 11/02/12 was 197 pounds

## 2012-12-04 ENCOUNTER — Telehealth: Payer: Self-pay

## 2012-12-04 NOTE — Telephone Encounter (Signed)
I called Johnny Wright to explain d/c weight from [facility] 11/02/12 was 197 pounds

## 2012-12-04 NOTE — Telephone Encounter (Signed)
Johnny Wright called and states she called Dr Jasper Loser office, states they did not weight pt at the April visit, due to his fatigue. She states that she will get the hospital records to try and get a base weight.

## 2012-12-04 NOTE — Telephone Encounter (Signed)
She mentions pt is having "drops in blood pressure" at meal times. I explained Dr. Mariah Milling would need BP readings to address. Understanding verb She will call me with these tomm

## 2012-12-04 NOTE — Telephone Encounter (Signed)
Leann called and states she called Dr Lamb's office, states they did not weight pt at the April visit, due to his fatigue. She states that she will get the hospital records to try and get a base weight.  

## 2012-12-13 ENCOUNTER — Ambulatory Visit (INDEPENDENT_AMBULATORY_CARE_PROVIDER_SITE_OTHER): Payer: Medicare Other | Admitting: Internal Medicine

## 2012-12-13 ENCOUNTER — Encounter: Payer: Self-pay | Admitting: Internal Medicine

## 2012-12-13 VITALS — BP 150/90 | HR 79 | Ht 73.0 in | Wt 182.0 lb

## 2012-12-13 DIAGNOSIS — I959 Hypotension, unspecified: Secondary | ICD-10-CM

## 2012-12-13 DIAGNOSIS — R55 Syncope and collapse: Secondary | ICD-10-CM

## 2012-12-13 DIAGNOSIS — I10 Essential (primary) hypertension: Secondary | ICD-10-CM

## 2012-12-13 DIAGNOSIS — G459 Transient cerebral ischemic attack, unspecified: Secondary | ICD-10-CM

## 2012-12-13 DIAGNOSIS — Z950 Presence of cardiac pacemaker: Secondary | ICD-10-CM

## 2012-12-13 DIAGNOSIS — R001 Bradycardia, unspecified: Secondary | ICD-10-CM

## 2012-12-13 DIAGNOSIS — I498 Other specified cardiac arrhythmias: Secondary | ICD-10-CM

## 2012-12-13 LAB — PACEMAKER DEVICE OBSERVATION
AL IMPEDENCE PM: 493 Ohm
AL THRESHOLD: 0.5 V
ATRIAL PACING PM: 100
BAMS-0001: 150 {beats}/min
BATTERY VOLTAGE: 2.79 V
RV LEAD AMPLITUDE: 22.4 mv

## 2012-12-13 NOTE — Assessment & Plan Note (Signed)
The patient's device was interrogated and the information was fully reviewed.  The device was reprogrammed to  increae rate response to activity as well as lower the threshold from med/low to low to improve the response to activity

## 2012-12-13 NOTE — Assessment & Plan Note (Signed)
Would ask Dr Knute Neu to consider the use of apixaban  He is at high risk for TE given afib and prior TIA and the risks of bleeding are not much greater if at all than the aspirin he is already on

## 2012-12-13 NOTE — Assessment & Plan Note (Signed)
As above.

## 2012-12-13 NOTE — Assessment & Plan Note (Signed)
Post prandial hypotension  Continue care at meal time

## 2012-12-13 NOTE — Patient Instructions (Addendum)
Your physician wants you to follow-up in: October with Dr. Graciela Husbands and device check.You will receive a reminder letter in the mail two months in advance. If you don't receive a letter, please call our office to schedule the follow-up appointment.

## 2012-12-13 NOTE — Progress Notes (Signed)
[facility] Patient Care Team: [name], MD as PCP - General (Unknown Physician Specialty)   HPI  Johnny Wright is a 79 y.o. male  seen in followup for tachybbrady syndrome with rapid afib and sinus brady for which he underwent pacing in 05/2011  CHADSVAsc score of 5; He remains on ASA And plavix  He is much improved since pacing and is now back in independent living. He and his daughter are very pleased with improvement  He continues to function quite well. His major problem however has been lightheadedness and low blood pressurepost prandially  This has persisted He takes antihypertensives and his Parkinson medicines in the morning He was recently hosp for CHF  Hospital records reviewed  he lost about 31 obs which he has been able to keep off  He has increased walking distance from 0-300 ft Past Medical History  Diagnosis Date  . Arthritic-like pain   . Cerebrovascular disease     Multiple TIAs/strokes  . Hyperlipidemia   . HTN (hypertension)   . Parkinson's disease     early stages  . Atrial fibrillation or flutter   . Sinus bradycardia   . Syncope   . Sinoatrial node dysfunction   . Pacemaker     Past Surgical History  Procedure Laterality Date  . Appendectomy  1945  . Cervical disc surgery  1970  . Cataract extraction  2005  . Pacemaker insertion      Current Outpatient Prescriptions  Medication Sig Dispense Refill  . acetaminophen (TYLENOL) 325 MG tablet Take 650 mg by mouth every 6 (six) hours as needed.        Marland Kitchen amiodarone (PACERONE) 200 MG tablet Take 0.5 tablets (100 mg total) by mouth daily.  45 tablet  1  . aspirin (ASPIR-81) 81 MG EC tablet Take 81 mg by mouth every other day.        Marland Kitchen atenolol (TENORMIN) 50 MG tablet Take 1 tablet (50 mg total) by mouth at bedtime.  90 tablet  1  . atorvastatin (LIPITOR) 20 MG tablet Take 5 mg by mouth daily.       . carbidopa-levodopa (SINEMET CR) 50-200 MG per tablet Take 1 tablet in the am 1/2 tablet 8 pm. Take one  full tablet at 10 am,  Take 1 1/2 tablet at 2 pm & 1 tablet at 5 pm.      . clopidogrel (PLAVIX) 75 MG tablet Take 75 mg by mouth daily.        . diclofenac (FLECTOR) 1.3 % PTCH Place 1 patch onto the skin as needed.       Marland Kitchen glucosamine-chondroitin 500-400 MG tablet Take 1 tablet by mouth daily.        . Multiple Vitamins-Minerals (MULTIVITAL) tablet Take 1 tablet by mouth daily.        . potassium chloride (K-DUR) 10 MEQ tablet Take 10 mEq by mouth daily. Take only when you take an extra Torsemide.      . Saw Palmetto 450 MG CAPS Take by mouth as needed.       . Tamsulosin HCl (FLOMAX) 0.4 MG CAPS Take 0.4 mg by mouth daily.      Marland Kitchen torsemide (DEMADEX) 20 MG tablet Take 20 mg by mouth daily. Take an extra tablet once daily if weight gain of 2 pounds in 24 hours.      . triamcinolone cream (KENALOG) 0.1 % Apply topically 2 (two) times daily.      . [DISCONTINUED] hydrALAZINE (APRESOLINE)  50 MG tablet Take 50 mg by mouth 4 (four) times daily. Hold for systolic pressure less than 150.       No current facility-administered medications for this visit.    Allergies  Allergen Reactions  . Diltiazem Hcl (Diltiazem Hcl)      Severe lower extremity edema  . Magnesium-Containing Compounds     Review of Systems negative except from HPI and PMH  Physical Exam BP 150/90  Pulse 79  Ht 6\' 1"  (1.854 m)  Wt 182 lb (82.555 kg)  BMI 24.02 kg/m2 Well developed and nourished in no acute distress stiiting in a wheel chair HENT normal Neck supple with JVP-flat Clear Device pocket well healed; without hematoma or erythema Regular rate and rhythm, no murmurs or gallops Abd-soft with active BS No Clubbing cyanosis edema Skin-warm and dry A & Oriented  Grossly normal sensory and motor function    Assessment and  Plan

## 2012-12-13 NOTE — Assessment & Plan Note (Signed)
Continue current threeapy

## 2013-01-09 ENCOUNTER — Telehealth: Payer: Self-pay | Admitting: Neurology

## 2013-01-28 DIAGNOSIS — I509 Heart failure, unspecified: Secondary | ICD-10-CM

## 2013-01-28 DIAGNOSIS — Z01810 Encounter for preprocedural cardiovascular examination: Secondary | ICD-10-CM

## 2013-01-28 NOTE — Telephone Encounter (Signed)
Pt's dtr wants [place] to know pt was taken via ambulance this am from [facility] for CHF exacerbation Says last time he was admitted they gave him lisinopril for high BP "protocol" and BP dropped "severely Wanted [place] to make ER aware I told her I would fax last office note with current med list and her concerns [id number] Understanding verb

## 2013-01-28 NOTE — Telephone Encounter (Signed)
Pt daughter states he was transported to [facility], due to swelling, fluid build up, she just wanted to let Dr Mariah Milling know. Please call This encounter was created in error - please disregard.

## 2013-01-29 ENCOUNTER — Telehealth: Payer: Self-pay | Admitting: *Deleted

## 2013-02-12 NOTE — Telephone Encounter (Signed)
FYI

## 2013-02-12 NOTE — Telephone Encounter (Signed)
Father passed away 02/17/2013 early this morning. Please share with Dr. Mariah Milling and Dr. Graciela Husbands

## 2013-02-12 DEATH — deceased

## 2013-02-28 ENCOUNTER — Ambulatory Visit: Payer: Medicare Other | Admitting: Cardiovascular Disease

## 2013-03-15 IMAGING — CR DG CHEST 1V PORT
1 series · 1 of 1 positions shown · non-contrast
Comparison: none

REASON FOR EXAM: sob
COMMENTS:

[portable]
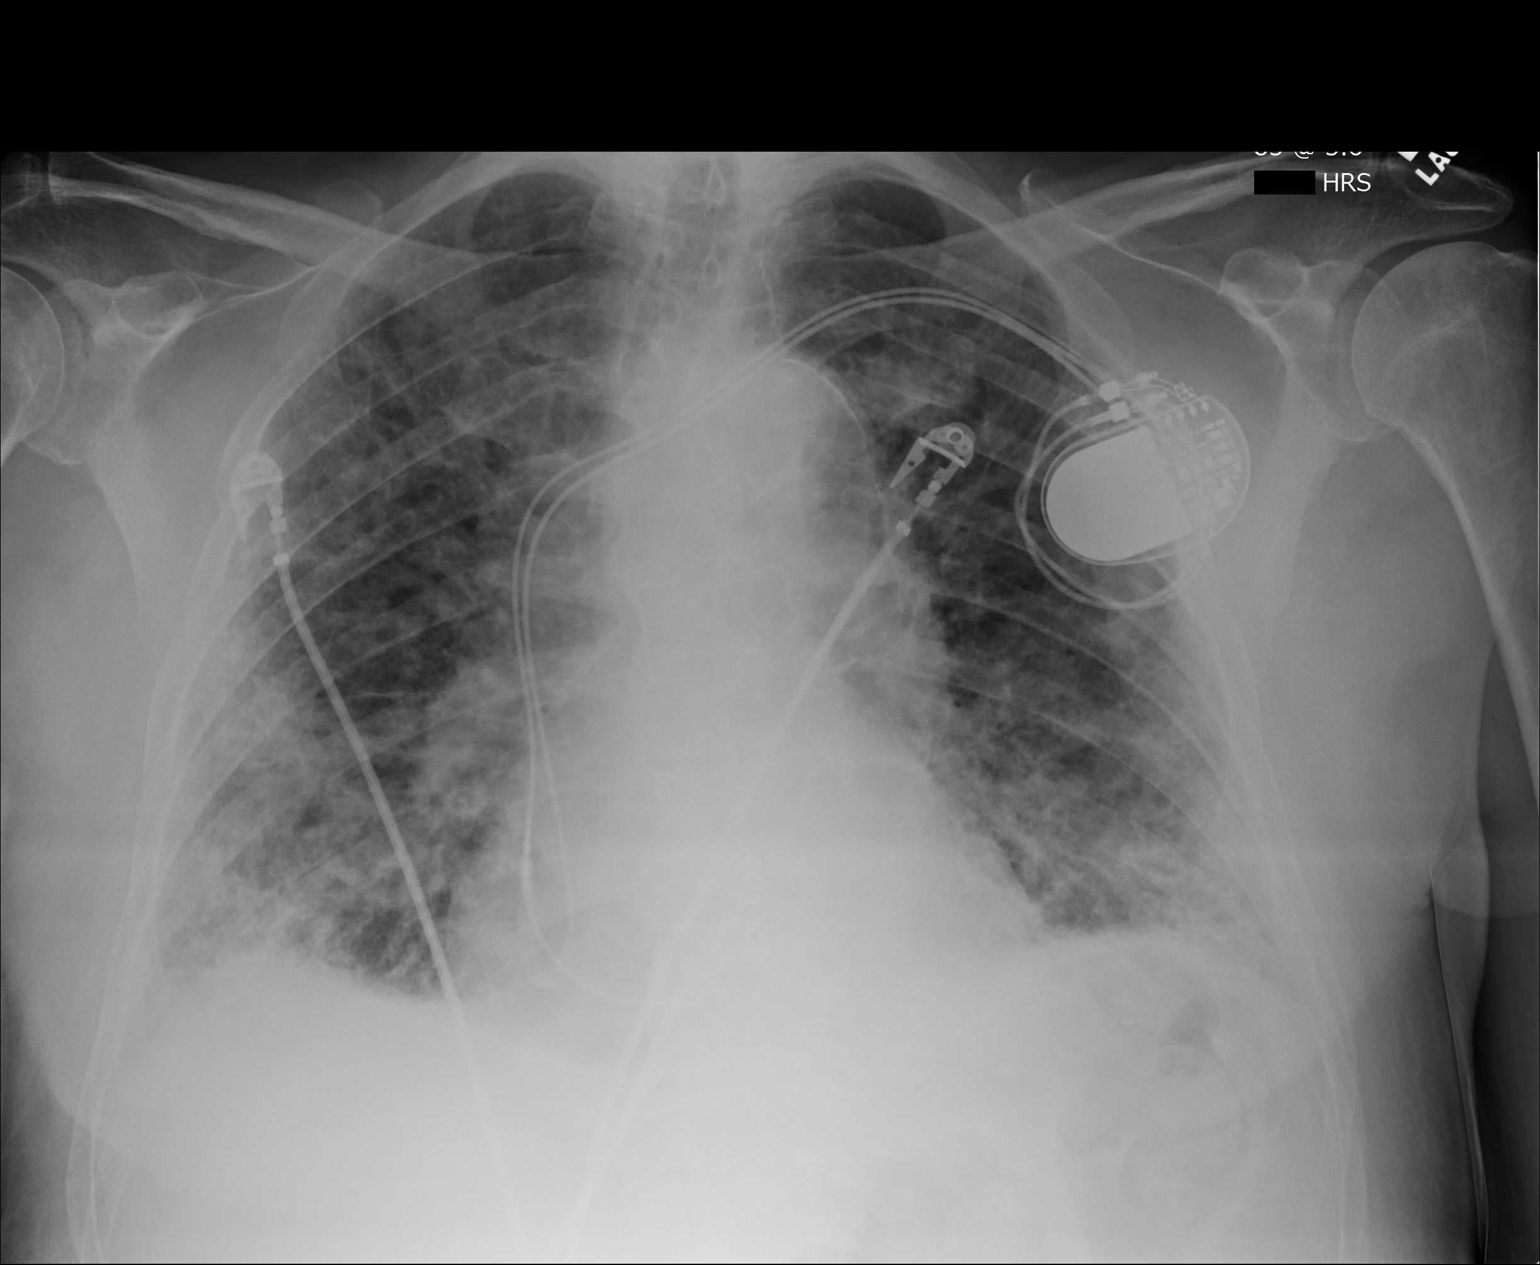

[1 of 1 positions shown; findings below may reference images not displayed]

PROCEDURE:     DXR - DXR PORTABLE CHEST SINGLE VIEW  - December 15, 2011  [DATE]

RESULT:     Cardiomegaly. Pulmonary vascular prominence and severe
interstitial prominence noted. These findings are consistent with congestive
heart failure. Interstitial pneumonitis/pneumonia may also be present.
Cardiac pacer noted.
IMPRESSION: Findings consistent with congestive heart failure and/or
bilateral severe pneumonia.

## 2013-04-01 ENCOUNTER — Ambulatory Visit: Payer: Self-pay | Admitting: Neurology

## 2013-10-23 NOTE — Telephone Encounter (Signed)
Pt is deceased closing encounter °

## 2014-01-28 IMAGING — CR DG CHEST 1V PORT
1 series · 1 of 1 positions shown · non-contrast
Comparison: none

REASON FOR EXAM: sob
COMMENTS:

[ap]
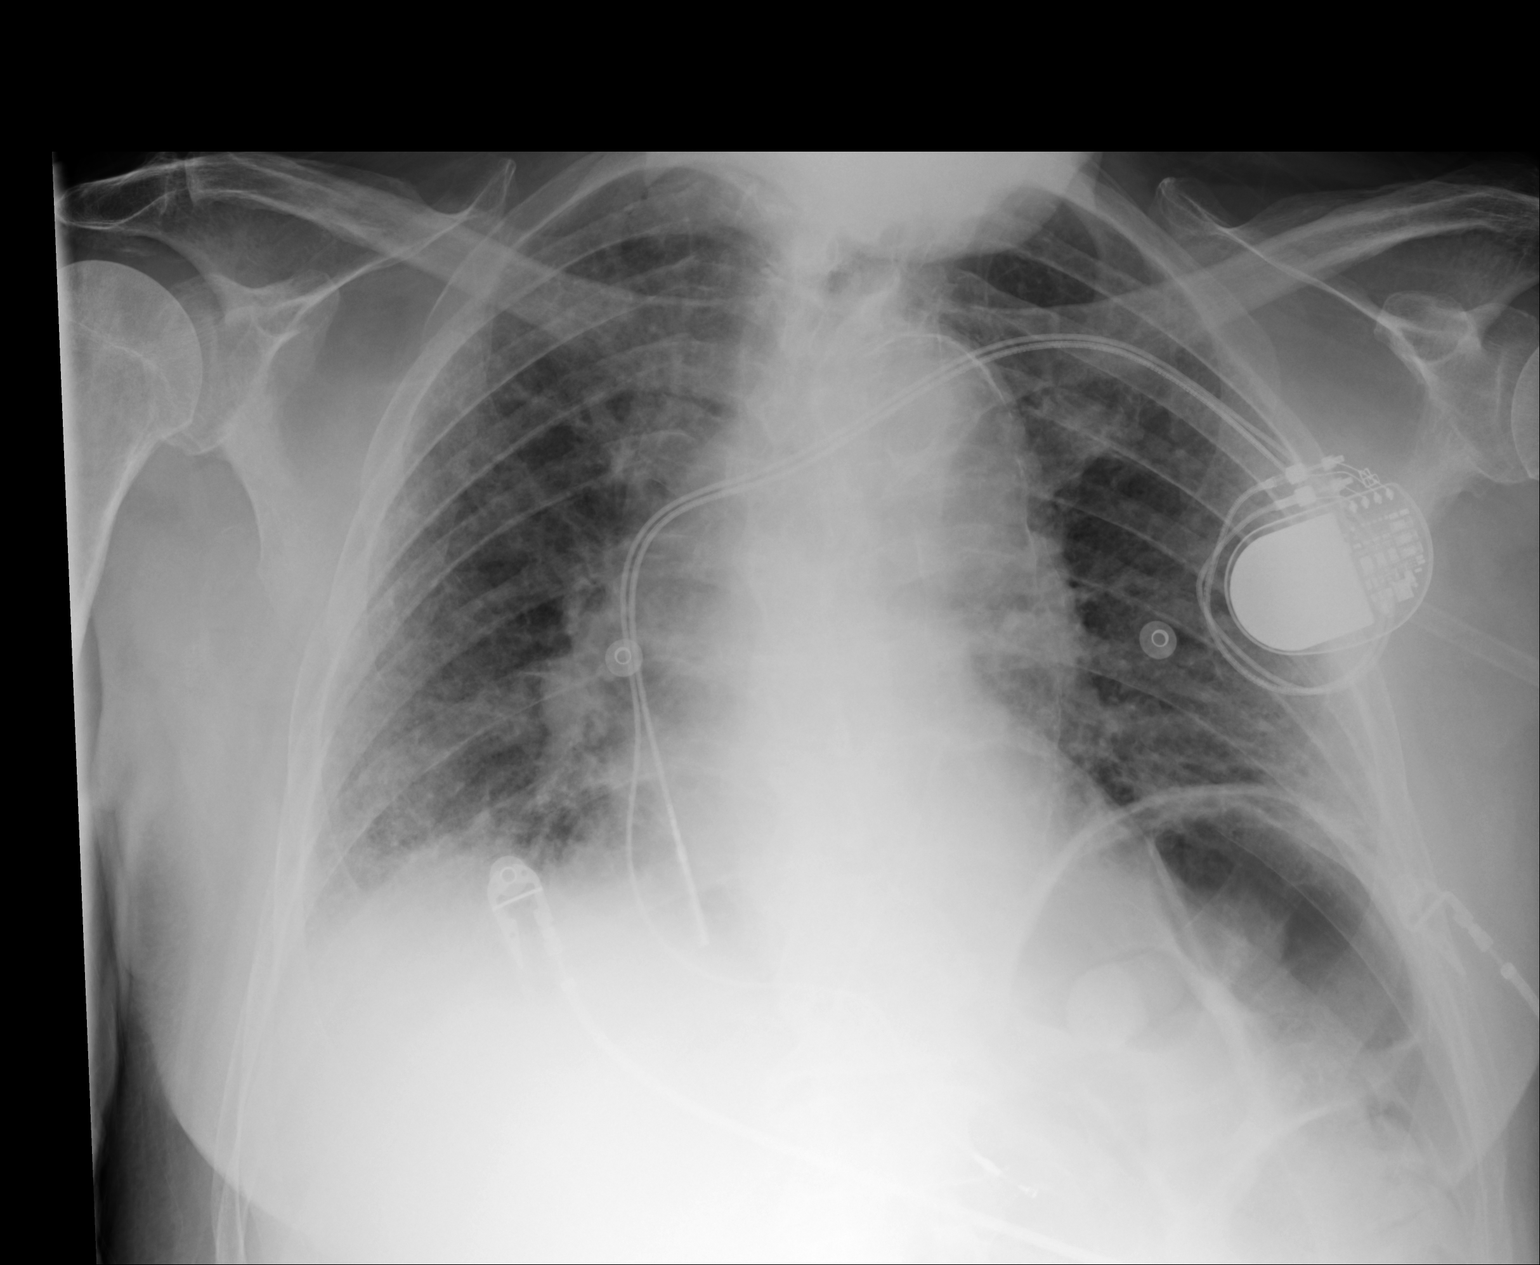

[1 of 1 positions shown; findings below may reference images not displayed]

PROCEDURE:     DXR - DXR PORTABLE CHEST SINGLE VIEW  - October 29, 2012 [DATE]

RESULT:     Comparison is made to the study December 15, 2011.

The lungs are mildly hypoinflated. The interstitial markings are increased
but this is not new. The cardiac silhouette is mildly enlarged though
stable. There is tortuosity of the descending thoracic aorta. There is a
permanent pacemaker in place. The pulmonary vascularity is not clearly
engorged. There is a loop of gas filled presumably large bowel under the
left hemidiaphragm.
IMPRESSION: There are chronically increased interstitial markings of
both lungs which may reflect chronic CHF or fibrosis. There is no focal
pneumonia. A followup PA and lateral chest x-ray would be of value.

[REDACTED]
